# Patient Record
Sex: Female | Born: 1965 | Race: Black or African American | Hispanic: No | Marital: Single | Smoking: Former smoker
Health system: Southern US, Community
[De-identification: ages and names within clinical notes are randomized; demographics above are authoritative.]

## PROBLEM LIST (undated history)

## (undated) DIAGNOSIS — I10 Essential (primary) hypertension: Secondary | ICD-10-CM

## (undated) DIAGNOSIS — E119 Type 2 diabetes mellitus without complications: Secondary | ICD-10-CM

## (undated) HISTORY — PX: ABDOMINAL HYSTERECTOMY: SHX81

---

## 2013-10-12 DIAGNOSIS — E1169 Type 2 diabetes mellitus with other specified complication: Secondary | ICD-10-CM | POA: Insufficient documentation

## 2013-10-12 DIAGNOSIS — I10 Essential (primary) hypertension: Secondary | ICD-10-CM | POA: Insufficient documentation

## 2013-10-12 DIAGNOSIS — E785 Hyperlipidemia, unspecified: Secondary | ICD-10-CM | POA: Insufficient documentation

## 2014-04-06 ENCOUNTER — Emergency Department: Payer: Self-pay | Admitting: Emergency Medicine

## 2014-04-06 LAB — COMPREHENSIVE METABOLIC PANEL
ALK PHOS: 94 U/L
ANION GAP: 6 — AB (ref 7–16)
AST: 12 U/L — AB (ref 15–37)
Albumin: 3.6 g/dL (ref 3.4–5.0)
BILIRUBIN TOTAL: 0.4 mg/dL (ref 0.2–1.0)
BUN: 15 mg/dL (ref 7–18)
CALCIUM: 10.2 mg/dL — AB (ref 8.5–10.1)
CHLORIDE: 98 mmol/L (ref 98–107)
Co2: 27 mmol/L (ref 21–32)
Creatinine: 0.62 mg/dL (ref 0.60–1.30)
EGFR (African American): >60
EGFR (Non-African Amer.): >60
Glucose: 347 mg/dL — ABNORMAL HIGH (ref 65–99)
OSMOLALITY: 277 (ref 275–301)
Potassium: 4 mmol/L (ref 3.5–5.1)
SGPT (ALT): 22 U/L (ref 12–78)
Sodium: 131 mmol/L — ABNORMAL LOW (ref 136–145)
TOTAL PROTEIN: 7.8 g/dL (ref 6.4–8.2)

## 2014-04-06 LAB — HEMOGLOBIN A1C: Hemoglobin A1C: 10.8 % — ABNORMAL HIGH (ref 4.2–6.3)

## 2014-04-06 LAB — URINALYSIS, COMPLETE
BILIRUBIN, UR: NEGATIVE
BLOOD: NEGATIVE
Bacteria: NONE SEEN
LEUKOCYTE ESTERASE: NEGATIVE
Nitrite: NEGATIVE
PROTEIN: NEGATIVE
Ph: 5 (ref 4.5–8.0)
Specific Gravity: 1.027 (ref 1.003–1.030)
WBC UR: 1 /HPF (ref 0–5)

## 2014-04-06 LAB — CBC
HCT: 44.4 % (ref 35.0–47.0)
HGB: 15 g/dL (ref 12.0–16.0)
MCH: 31.3 pg (ref 26.0–34.0)
MCHC: 33.8 g/dL (ref 32.0–36.0)
MCV: 93 fL (ref 80–100)
Platelet: 334 10*3/uL (ref 150–440)
RBC: 4.79 10*6/uL (ref 3.80–5.20)
RDW: 13.7 % (ref 11.5–14.5)
WBC: 9.1 10*3/uL (ref 3.6–11.0)

## 2014-04-06 LAB — TROPONIN I: Troponin-I: <0.02 ng/mL

## 2014-09-15 ENCOUNTER — Emergency Department: Payer: Self-pay | Admitting: Emergency Medicine

## 2015-12-22 ENCOUNTER — Emergency Department
Admission: EM | Admit: 2015-12-22 | Discharge: 2015-12-22 | Disposition: A | Discharge status: Home / Self Care | Payer: Commercial Managed Care - PPO | Attending: Emergency Medicine | Admitting: Emergency Medicine

## 2015-12-22 ENCOUNTER — Encounter: Payer: Self-pay | Admitting: Emergency Medicine

## 2015-12-22 DIAGNOSIS — E1165 Type 2 diabetes mellitus with hyperglycemia: Secondary | ICD-10-CM | POA: Insufficient documentation

## 2015-12-22 DIAGNOSIS — Z7984 Long term (current) use of oral hypoglycemic drugs: Secondary | ICD-10-CM | POA: Diagnosis not present

## 2015-12-22 DIAGNOSIS — R739 Hyperglycemia, unspecified: Secondary | ICD-10-CM

## 2015-12-22 DIAGNOSIS — I10 Essential (primary) hypertension: Secondary | ICD-10-CM | POA: Diagnosis not present

## 2015-12-22 DIAGNOSIS — Z79899 Other long term (current) drug therapy: Secondary | ICD-10-CM | POA: Diagnosis not present

## 2015-12-22 DIAGNOSIS — F17210 Nicotine dependence, cigarettes, uncomplicated: Secondary | ICD-10-CM | POA: Diagnosis not present

## 2015-12-22 DIAGNOSIS — K0889 Other specified disorders of teeth and supporting structures: Secondary | ICD-10-CM | POA: Diagnosis not present

## 2015-12-22 DIAGNOSIS — Z7982 Long term (current) use of aspirin: Secondary | ICD-10-CM | POA: Diagnosis not present

## 2015-12-22 HISTORY — DX: Essential (primary) hypertension: I10

## 2015-12-22 HISTORY — DX: Type 2 diabetes mellitus without complications: E11.9

## 2015-12-22 LAB — CBC
HCT: 42.9 % (ref 35.0–47.0)
Hemoglobin: 14.3 g/dL (ref 12.0–16.0)
MCH: 30.2 pg (ref 26.0–34.0)
MCHC: 33.3 g/dL (ref 32.0–36.0)
MCV: 90.6 fL (ref 80.0–100.0)
PLATELETS: 344 10*3/uL (ref 150–440)
RBC: 4.74 MIL/uL (ref 3.80–5.20)
RDW: 13.6 % (ref 11.5–14.5)
WBC: 9.3 10*3/uL (ref 3.6–11.0)

## 2015-12-22 LAB — URINALYSIS COMPLETE WITH MICROSCOPIC (ARMC ONLY)
Bacteria, UA: NONE SEEN
Bilirubin Urine: NEGATIVE
HGB URINE DIPSTICK: NEGATIVE
Leukocytes, UA: NEGATIVE
NITRITE: NEGATIVE
PROTEIN: NEGATIVE mg/dL
SPECIFIC GRAVITY, URINE: 1.026 (ref 1.005–1.030)
pH: 5 (ref 5.0–8.0)

## 2015-12-22 LAB — COMPREHENSIVE METABOLIC PANEL
ALT: 24 U/L (ref 14–54)
AST: 15 U/L (ref 15–41)
Albumin: 3.5 g/dL (ref 3.5–5.0)
Alkaline Phosphatase: 80 U/L (ref 38–126)
Anion gap: 5 (ref 5–15)
BILIRUBIN TOTAL: 0.3 mg/dL (ref 0.3–1.2)
BUN: 15 mg/dL (ref 6–20)
CHLORIDE: 101 mmol/L (ref 101–111)
CO2: 25 mmol/L (ref 22–32)
Calcium: 9.4 mg/dL (ref 8.9–10.3)
Creatinine, Ser: 0.7 mg/dL (ref 0.44–1.00)
Glucose, Bld: 301 mg/dL — ABNORMAL HIGH (ref 65–99)
POTASSIUM: 3.8 mmol/L (ref 3.5–5.1)
Sodium: 131 mmol/L — ABNORMAL LOW (ref 135–145)
TOTAL PROTEIN: 6.8 g/dL (ref 6.5–8.1)

## 2015-12-22 LAB — GLUCOSE, CAPILLARY
GLUCOSE-CAPILLARY: 250 mg/dL — AB (ref 65–99)
Glucose-Capillary: 295 mg/dL — ABNORMAL HIGH (ref 65–99)

## 2015-12-22 MED ORDER — CEPHALEXIN 500 MG PO CAPS: 500.0000 mg | ORAL_CAPSULE | Freq: Four times a day (QID) | ORAL | Status: AC

## 2015-12-22 MED ORDER — CEPHALEXIN 500 MG PO CAPS
500.0000 mg | ORAL_CAPSULE | Freq: Once | ORAL | Status: AC
  Administered 2015-12-22: 500 mg via ORAL
  Filled 2015-12-22: qty 1

## 2015-12-22 NOTE — ED Notes (Addendum)
Patient ambulatory to triage with steady gait, without difficulty or distress noted; pt reports BS elevated x week; seen PCP for dizziness and blurry vision and rx  glipizide ER and has not controlled it; BS 463 at home; pt denies any c/o at present but does st she has had recent left lower tooth sensitivity recently

## 2015-12-22 NOTE — ED Notes (Signed)
Pt presents to ED with c/o elevated blood sugar x 1 week. States was seen by PCP 3 days ago and placed on Glipizide ER 10 mg/day, along with her Metformin 1000g BID. Pt reports checked blood sugar at 4 am this morning, it registered 461. Pt states "I don't understand why it doubles after I eat, I can't get my sugar under 200...it's been running 550-570 for the past week." Pt is to follow up with PCP in 3 months. Pt also c/o left lower tooth pain, pt states "I wonder if that is what's making my sugar go up."  Pt denies chest pain, shortness of breath, dizziness or vision problems at this time. Pt alert and oriented x 4, no increased work in breathing noted. MD at bedside.

## 2015-12-22 NOTE — ED Provider Notes (Signed)
[facility] Emergency Department Provider Note  ____________________________________________  Time seen: Approximately 518 AM  I have reviewed the triage vital signs and the nursing notes.   HISTORY  Chief Complaint Hyperglycemia    HPI Ashley Ochoa is a 50 y.o. female who comes into the hospital with elevated blood sugar. The patient reports that she is unable to get her sugars under 200 and have been running 500-570. She reports that she checked it at work tonight at 4 AM and the blood sugar was 461. She reports her blood sugar was down to 250 the other day but as soon as she ate a shot back up. The patient reports that her tooth has been bothering her and she is unsure if that has anything to do with her blood sugars being elevated. The patient saw her primary care physician 3 days ago and was started on glipizide ER to take with her metformin in the morning. The patient reports that it seems as though her symptoms when going on for the past week although it's been longer than that. The patient reports that she hadn't been checking her sugars prior to week ago. The patient reports that she ate poorly over the holidays but has been trying to eat sandwiches as well as solids and green vegetables. She reports that she is supposed to follow back up with her primary care physician in 3 months. The patient came in discussion was unsure what to do with her blood sugar being so high.   Past Medical History  Diagnosis Date  . Diabetes mellitus without complication (HCC)   . Hypertension     There are no active problems to display for this patient.   Past Surgical History  Procedure Laterality Date  . Abdominal hysterectomy      partial     Current Outpatient Rx  Name  Route  Sig  Dispense  Refill  . aspirin EC 81 MG tablet   Oral   Take 81 mg by mouth daily.         Marland Kitchen glipiZIDE (GLUCOTROL XL) 10 MG 24 hr tablet   Oral   Take 1 tablet by mouth daily.        Marland Kitchen lisinopril (PRINIVIL,ZESTRIL) 5 MG tablet   Oral   Take 5 mg by mouth daily.         . metFORMIN (GLUCOPHAGE) 1000 MG tablet   Oral   Take 1,000 mg by mouth 2 (two) times daily with a meal.         . simvastatin (ZOCOR) 20 MG tablet   Oral   Take 1 tablet by mouth daily.           Allergies Review of patient's allergies indicates no known allergies.  No family history on file.  Social History Social History  Substance Use Topics  . Smoking status: Current Every Day Smoker -- 0.50 packs/day    Types: Cigarettes  . Smokeless tobacco: None  . Alcohol Use: No    Review of Systems Constitutional: No fever/chills Eyes: No visual changes. ENT: No sore throat. Cardiovascular: Denies chest pain. Respiratory: Denies shortness of breath. Gastrointestinal: No abdominal pain.  No nausea, no vomiting.  No diarrhea.  No constipation. Genitourinary: Negative for dysuria. Musculoskeletal: Negative for back pain. Skin: Negative for rash. Neurological: Negative for headaches, focal weakness or numbness.  10-point ROS otherwise negative.  ____________________________________________   PHYSICAL EXAM:  VITAL SIGNS: ED Triage Vitals  Enc Vitals Group  BP 12/22/15 0444 141/85 mmHg     Pulse Rate 12/22/15 0444 88     Resp 12/22/15 0444 18     Temp 12/22/15 0444 97.9 F (36.6 C)     Temp Source 12/22/15 0444 Oral     SpO2 12/22/15 0444 95 %     Weight 12/22/15 0444 198 lb 3.2 oz (89.903 kg)     Height 12/22/15 0444  (1.549 m)     Head Cir --      Peak Flow --      Pain Score --      Pain Loc --      Pain Edu? --      Excl. in GC? --     Constitutional: Alert and oriented. Well appearing and in no acute distress. Eyes: Conjunctivae are normal. PERRL. EOMI. Head: Atraumatic. Nose: No congestion/rhinnorhea. Mouth/Throat: Mucous membranes are moist.  Oropharynx non-erythematous. Cardiovascular: Normal rate, regular rhythm. Grossly normal heart sounds.   Good peripheral circulation. Respiratory: Normal respiratory effort.  No retractions. Lungs CTAB. Gastrointestinal: Soft and nontender. No distention. Positive bowel sounds Musculoskeletal: No lower extremity tenderness nor edema.   Neurologic:  Normal speech and language.  Skin:  Skin is warm, dry and intact.  Psychiatric: Mood and affect are normal.   ____________________________________________   LABS (all labs ordered are listed, but only abnormal results are displayed)  Labs Reviewed  GLUCOSE, CAPILLARY - Abnormal; Notable for the following:    Glucose-Capillary 295 (*)    All other components within normal limits  COMPREHENSIVE METABOLIC PANEL - Abnormal; Notable for the following:    Sodium 131 (*)    Glucose, Bld 301 (*)    All other components within normal limits  URINALYSIS COMPLETEWITH MICROSCOPIC ([facility] ONLY) - Abnormal; Notable for the following:    Color, Urine YELLOW (*)    APPearance CLEAR (*)    Glucose, UA >500 (*)    Ketones, ur TRACE (*)    Squamous Epithelial / LPF 0-5 (*)    All other components within normal limits  CBC  CBG MONITORING, ED  CBG MONITORING, ED   ____________________________________________  EKG  none ____________________________________________  RADIOLOGY  none ____________________________________________   PROCEDURES  Procedure(s) performed: None  Critical Care performed: No  ____________________________________________   INITIAL IMPRESSION / ASSESSMENT AND PLAN / ED COURSE  Pertinent labs & imaging results that were available during my care of the patient were reviewed by me and considered in my medical decision making (see chart for details).  This is a 50 year old female who comes into the hospital today with elevated blood sugar. She reports that every time she eats her blood sugars go up. We had a conversation about eating too much bread and starches which will elevate her blood sugar. The patient also is having  some tooth pain in her jaw which may be contributing to her symptoms. The patient's urinalysis is unremarkable and her CBC is unremarkable. The patient is able to orally hydrate and I did have a long conversation with the patient about the appropriate dietary restrictions for patient with diabetes. I informed her that she needs to be on her medication for a little bit longer than 3 days to ensure that is actually working. The patient will be discharged to home to follow-up with her primary care physician. Her repeat fingerstick blood sugar was 250. ____________________________________________   FINAL CLINICAL IMPRESSION(S) / ED DIAGNOSES  Final diagnoses:  Hyperglycemia  Pain, dental      Revonda Standard  Catalina Gravel, MD 12/22/15 [id number]

## 2015-12-22 NOTE — Discharge Instructions (Signed)
Hyperglycemia °Hyperglycemia occurs when the glucose (sugar) in your blood is too high. Hyperglycemia can happen for many reasons, but it most often happens to people who do not know they have diabetes or are not managing their diabetes properly.  °CAUSES  °Whether you have diabetes or not, there are other causes of hyperglycemia. Hyperglycemia can occur when you have diabetes, but it can also occur in other situations that you might not be as aware of, such as: °Diabetes °· If you have diabetes and are having problems controlling your blood glucose, hyperglycemia could occur because of some of the following reasons: °¨ Not following your meal plan. °¨ Not taking your diabetes medications or not taking it properly. °¨ Exercising less or doing less activity than you normally do. °¨ Being sick. °Pre-diabetes °· This cannot be ignored. Before people develop Type 2 diabetes, they almost always have "pre-diabetes." This is when your blood glucose levels are higher than normal, but not yet high enough to be diagnosed as diabetes. Research has shown that some long-term damage to the body, especially the heart and circulatory system, may already be occurring during pre-diabetes. If you take action to manage your blood glucose when you have pre-diabetes, you may delay or prevent Type 2 diabetes from developing. °Stress °· If you have diabetes, you may be "diet" controlled or on oral medications or insulin to control your diabetes. However, you may find that your blood glucose is higher than usual in the hospital whether you have diabetes or not. This is often referred to as "stress hyperglycemia." Stress can elevate your blood glucose. This happens because of hormones put out by the body during times of stress. If stress has been the cause of your high blood glucose, it can be followed regularly by your caregiver. That way he/she can make sure your hyperglycemia does not continue to get worse or progress to  diabetes. °Steroids °· Steroids are medications that act on the infection fighting system (immune system) to block inflammation or infection. One side effect can be a rise in blood glucose. Most people can produce enough extra insulin to allow for this rise, but for those who cannot, steroids make blood glucose levels go even higher. It is not unusual for steroid treatments to "uncover" diabetes that is developing. It is not always possible to determine if the hyperglycemia will go away after the steroids are stopped. A special blood test called an A1c is sometimes done to determine if your blood glucose was elevated before the steroids were started. °SYMPTOMS °· Thirsty. °· Frequent urination. °· Dry mouth. °· Blurred vision. °· Tired or fatigue. °· Weakness. °· Sleepy. °· Tingling in feet or leg. °DIAGNOSIS  °Diagnosis is made by monitoring blood glucose in one or all of the following ways: °· A1c test. This is a chemical found in your blood. °· Fingerstick blood glucose monitoring. °· Laboratory results. °TREATMENT  °First, knowing the cause of the hyperglycemia is important before the hyperglycemia can be treated. Treatment may include, but is not be limited to: °· Education. °· Change or adjustment in medications. °· Change or adjustment in meal plan. °· Treatment for an illness, infection, etc. °· More frequent blood glucose monitoring. °· Change in exercise plan. °· Decreasing or stopping steroids. °· Lifestyle changes. °HOME CARE INSTRUCTIONS  °· Test your blood glucose as directed. °· Exercise regularly. Your caregiver will give you instructions about exercise. Pre-diabetes or diabetes which comes on with stress is helped by exercising. °· Eat wholesome,   balanced meals. Eat often and at regular, fixed times. Your caregiver or nutritionist will give you a meal plan to guide your sugar intake. °· Being at an ideal weight is important. If needed, losing as little as 10 to 15 pounds may help improve blood  glucose levels. °SEEK MEDICAL CARE IF:  °· You have questions about medicine, activity, or diet. °· You continue to have symptoms (problems such as increased thirst, urination, or weight gain). °SEEK IMMEDIATE MEDICAL CARE IF:  °· You are vomiting or have diarrhea. °· Your breath smells fruity. °· You are breathing faster or slower. °· You are very sleepy or incoherent. °· You have numbness, tingling, or pain in your feet or hands. °· You have chest pain. °· Your symptoms get worse even though you have been following your caregiver's orders. °· If you have any other questions or concerns. °  °This information is not intended to replace advice given to you by your health care provider. Make sure you discuss any questions you have with your health care provider. °  °Document Released: 05/16/2001 Document Revised: 02/12/2012 Document Reviewed: 07/27/2015 °Elsevier Interactive Patient Education ©2016 [employer] ° °

## 2015-12-22 NOTE — ED Notes (Signed)

## 2016-10-29 ENCOUNTER — Encounter: Payer: Self-pay | Admitting: Emergency Medicine

## 2016-10-29 ENCOUNTER — Emergency Department
Admission: EM | Admit: 2016-10-29 | Discharge: 2016-10-29 | Disposition: A | Discharge status: Home / Self Care | Payer: Commercial Managed Care - PPO | Attending: Emergency Medicine | Admitting: Emergency Medicine

## 2016-10-29 ENCOUNTER — Emergency Department: Payer: Commercial Managed Care - PPO

## 2016-10-29 DIAGNOSIS — Z7982 Long term (current) use of aspirin: Secondary | ICD-10-CM | POA: Insufficient documentation

## 2016-10-29 DIAGNOSIS — F17210 Nicotine dependence, cigarettes, uncomplicated: Secondary | ICD-10-CM | POA: Insufficient documentation

## 2016-10-29 DIAGNOSIS — E119 Type 2 diabetes mellitus without complications: Secondary | ICD-10-CM | POA: Insufficient documentation

## 2016-10-29 DIAGNOSIS — I10 Essential (primary) hypertension: Secondary | ICD-10-CM | POA: Insufficient documentation

## 2016-10-29 DIAGNOSIS — Z7984 Long term (current) use of oral hypoglycemic drugs: Secondary | ICD-10-CM | POA: Insufficient documentation

## 2016-10-29 DIAGNOSIS — Z79899 Other long term (current) drug therapy: Secondary | ICD-10-CM | POA: Insufficient documentation

## 2016-10-29 DIAGNOSIS — M7552 Bursitis of left shoulder: Secondary | ICD-10-CM

## 2016-10-29 MED ORDER — NAPROXEN 500 MG PO TABS: 500.0000 mg | ORAL_TABLET | Freq: Two times a day (BID) | ORAL | Status: DC

## 2016-10-29 MED ORDER — ORPHENADRINE CITRATE 30 MG/ML IJ SOLN: 60.0000 mg | Freq: Two times a day (BID) | INTRAMUSCULAR | Status: DC

## 2016-10-29 MED ORDER — KETOROLAC TROMETHAMINE 60 MG/2ML IM SOLN: 30.0000 mg | Freq: Once | INTRAMUSCULAR | Status: DC

## 2016-10-29 NOTE — ED Provider Notes (Signed)
[facility] Emergency Department Provider Note   __[facility]__________________________________________   None    (approximate)  I have reviewed the triage vital signs and the nursing notes.   HISTORY  Chief Complaint Shoulder Pain    HPI Ashley Ochoa is a [age] y.o. female patient complaining of 3 weeks left shoulder pain. Patient states pain increase with abduction and opiate reaching. Patient denies any single provocative incident for pain. 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 No palliative measures for this complaint. Patient is right-hand dominant.   Past Medical History:  Diagnosis Date  . Diabetes mellitus without complication (HCC)   . Hypertension     There are no active problems to display for this patient.   Past Surgical History:  Procedure Laterality Date  . ABDOMINAL HYSTERECTOMY     partial     Prior to Admission medications   Medication Sig Start Date End Date Taking? Authorizing Provider  aspirin EC 81 MG tablet Take 81 mg by mouth daily.    Historical Provider, MD  glipiZIDE (GLUCOTROL XL) 10 MG 24 hr tablet Take 1 tablet by mouth daily. 12/15/15   Historical Provider, MD  lisinopril (PRINIVIL,ZESTRIL) 5 MG tablet Take 5 mg by mouth daily.    Historical Provider, MD  metFORMIN (GLUCOPHAGE) 1000 MG tablet Take 1,000 mg by mouth 2 (two) times daily with a meal.    Historical Provider, MD  naproxen (NAPROSYN) 500 MG tablet Take 1 tablet (500 mg total) by mouth 2 (two) times daily with a meal. 10/29/16   Joni Reiningonald K Venus Ruhe, PA-C  simvastatin (ZOCOR) 20 MG tablet Take 1 tablet by mouth daily. 05/10/15 05/09/16  Historical Provider, MD    Allergies Patient has no known allergies.  No family history on file.  Social History Social History  Substance Use Topics  . Smoking status: Current Every Day Smoker    Packs/day: 0.50    Types: Cigarettes  . Smokeless tobacco: Never Used  .  Alcohol use No    Review of Systems Constitutional: No fever/chills Eyes: No visual changes. ENT: No sore throat. Cardiovascular: Denies chest pain. Respiratory: Denies shortness of breath. Gastrointestinal: No abdominal pain.  No nausea, no vomiting.  No diarrhea.  No constipation. Genitourinary: Negative for dysuria. Musculoskeletal: Negative for back pain. Skin: Negative for rash. Neurological: Negative for headaches, focal weakness or numbness. Endocrine:Diabetes and hypertension   ____________________________________________   PHYSICAL EXAM:  VITAL SIGNS: ED Triage Vitals  Enc Vitals Group     BP 10/29/16 1627 128/89     Pulse Rate 10/29/16 1627 71     Resp 10/29/16 1627 16     Temp 10/29/16 1627 98.1 F (36.7 C)     Temp Source 10/29/16 1627 Oral     SpO2 10/29/16 1627 97 %     Weight 10/29/16 1630 201 lb (91.2 kg)     Height 10/29/16 1630 5\' 1"  (1.549 m)     Head Circumference --      Peak Flow --      Pain Score 10/29/16 1630 6     Pain Loc --      Pain Edu? --      Excl. in GC? --     Constitutional: Alert and oriented. Well appearing and in no acute distress. Eyes: Conjunctivae are normal. PERRL. EOMI. Head: Atraumatic. Nose: No congestion/rhinnorhea. Mouth/Throat: Mucous membranes are moist.  Oropharynx non-erythematous. Neck: No stridor.  No cervical spine tenderness to palpation. Hematological/Lymphatic/Immunilogical: No cervical lymphadenopathy. Cardiovascular: Normal rate, regular rhythm. Grossly normal heart sounds.  Good peripheral circulation. Respiratory: Normal respiratory effort.  No retractions. Lungs CTAB. Gastrointestinal: Soft and nontender. No distention. No abdominal bruits. No CVA tenderness. Musculoskeletal: Arms deformities the left upper shoulder. Patient decreased range of motion with abduction overhead reaching. Strength iresistance 3/5. Neurologic:  Normal speech and language. No gross focal neurologic deficits are appreciated.  No gait instability. Skin:  Skin is warm, dry and intact. No rash noted. Psychiatric: Mood and affect are normal. Speech and behavior are normal.  ____________________________________________   LABS (all labs ordered are listed, but only abnormal results are displayed)  Labs Reviewed - No data to display ____________________________________________  EKG   ____________________________________________  RADIOLOGY   __No acute findings x-ray of the left shoulder. __________________________________________   PROCEDURES  Procedure(s) performed: None  Procedures  Critical Care performed: No  ____________________________________________   INITIAL IMPRESSION / ASSESSMENT AND PLAN / ED COURSE  Pertinent labs & imaging results that were available during my care of the patient were reviewed by me and considered in my medical decision making (see chart for details).  Bursitis of the left shoulder. Patient given discharge care instructions. Patient given a prescription for naproxen and advised follow-up family doctor orthopedic clinic if condition persists.  Clinical Course      ____________________________________________   FINAL CLINICAL IMPRESSION(S) / ED DIAGNOSES  Final diagnoses:  Bursitis of shoulder, left      NEW MEDICATIONS STARTED DURING THIS VISIT:  New Prescriptions   NAPROXEN (NAPROSYN) 500 MG TABLET    Take 1 tablet (500 mg total) by mouth 2 (two) times daily with a meal.     Note:  This document was prepared using Dragon voice recognition software and may include unintentional dictation errors.    Joni Reiningonald K Beckie Viscardi, PA-C 10/29/16 1735    Jene Everyobert Kinner, MD 10/29/16 [phone]

## 2016-10-29 NOTE — ED Triage Notes (Signed)
Left shoulder pain x 3 weeks. Has not spoken with her pcp about the pain. Pain is with raising arm.

## 2016-11-01 NOTE — ED Notes (Signed)
Patient returns to ED Waiting Room 11/01/16 at approx. 1115 requesting work note from visit on 10/29/16.  Requesting to return to work 11/06/16.  Discussed request with Charge Nurse - Ashley Ochoa.  Explained to Ashley Ochoa that we could not write the work note she was requesting.  Offered patient a note stating that she was seen in the ED on 10/29/16.  Patient refused and was displeased.

## 2017-09-06 ENCOUNTER — Ambulatory Visit: Payer: Self-pay | Admitting: Family Medicine

## 2017-09-06 DIAGNOSIS — E119 Type 2 diabetes mellitus without complications: Secondary | ICD-10-CM

## 2017-09-06 DIAGNOSIS — I10 Essential (primary) hypertension: Secondary | ICD-10-CM

## 2017-09-06 DIAGNOSIS — E785 Hyperlipidemia, unspecified: Secondary | ICD-10-CM

## 2017-09-06 DIAGNOSIS — E1169 Type 2 diabetes mellitus with other specified complication: Secondary | ICD-10-CM

## 2017-09-06 MED ORDER — SIMVASTATIN 20 MG PO TABS: 20.0000 mg | ORAL_TABLET | Freq: Every day | ORAL | 0 refills | Status: DC

## 2017-09-06 MED ORDER — GLIPIZIDE ER 10 MG PO TB24: 10.0000 mg | ORAL_TABLET | Freq: Every day | ORAL | 0 refills | Status: DC

## 2017-09-06 MED ORDER — NAPROXEN 500 MG PO TABS: 500.0000 mg | ORAL_TABLET | Freq: Two times a day (BID) | ORAL | Status: DC

## 2017-09-06 MED ORDER — METFORMIN HCL 1000 MG PO TABS: 1000.0000 mg | ORAL_TABLET | Freq: Two times a day (BID) | ORAL | 0 refills | Status: DC

## 2017-09-06 MED ORDER — LISINOPRIL 5 MG PO TABS: 5.0000 mg | ORAL_TABLET | Freq: Every day | ORAL | 0 refills | Status: DC

## 2017-09-06 NOTE — Patient Instructions (Signed)
Take naproxen twice a day for up to 10 days.  Avoid strenous activities as much as possible.

## 2017-09-06 NOTE — Addendum Note (Signed)
Addended by: Hetty Ely on: 09/06/2017 07:56 PM   Modules accepted: Orders

## 2017-09-06 NOTE — Progress Notes (Signed)
Subjective:     Patient ID: Ashley Ochoa, female   DOB: [birth year], 51 y.o.   MRN: [id number]  HPI   Pt has type 2 DM.  Is on Glipidie and Metformin .  BS's usually are controlled, around, 130 in the AM and around 160's  later times of the day.  No increased thirst or urination.  No hypoglycemic Sx's.   She ran out of Lisinopril for HTN.  Takes Simvastatin daily.  Has not had labs checked in a year or more.   Has had soreness in her central chest for 3 weeks.  Hurts with movements or to the touch.  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   Review of Systems     Objective:   Physical Exam  A+O, no distress OP and Con clear Lungs CTA RRR, no murmur Abdm: obese, nontender +2 [pulses, no edema. Focal tenderness over the sternum, no nodule, erythema or warmth    Assessment:     and    Plan:  1.  DM. Stay on Glipizide once a day and Metformin twice a day. Check A1c, Met B and MA. 2.  HTN. Stay on Lisnopril daily.  Check CBC and Met B next week. 3.  Hyperlipidemia.  Stay on Simvastatin 20 mg daily.  Check lipids and liver. 4.  Sternal pain, possible costochondritis.  Start Naproxen 500 mg BID.  Avoid exacerbating activities. 5.  RTC in 3 months, sooner if needed for acute concerns.

## 2017-09-12 ENCOUNTER — Ambulatory Visit: Payer: Self-pay | Admitting: Internal Medicine

## 2017-12-05 ENCOUNTER — Other Ambulatory Visit: Payer: Self-pay

## 2017-12-11 ENCOUNTER — Ambulatory Visit: Payer: Self-pay

## 2017-12-12 ENCOUNTER — Other Ambulatory Visit: Payer: Self-pay

## 2017-12-12 DIAGNOSIS — I10 Essential (primary) hypertension: Secondary | ICD-10-CM

## 2017-12-12 DIAGNOSIS — E119 Type 2 diabetes mellitus without complications: Secondary | ICD-10-CM

## 2017-12-20 LAB — LIPID PANEL
CHOL/HDL RATIO: 2.8 ratio (ref 0.0–4.4)
CHOLESTEROL TOTAL: 154 mg/dL (ref 100–199)
HDL: 55 mg/dL (ref >39)
LDL Calculated: 76 mg/dL (ref 0–99)
TRIGLYCERIDES: 117 mg/dL (ref 0–149)
VLDL Cholesterol Cal: 23 mg/dL (ref 5–40)

## 2017-12-20 LAB — COMPREHENSIVE METABOLIC PANEL
A/G RATIO: 1.6 (ref 1.2–2.2)
ALK PHOS: 90 IU/L (ref 39–117)
ALT: 14 IU/L (ref 0–32)
AST: 12 IU/L (ref 0–40)
Albumin: 4.5 g/dL (ref 3.5–5.5)
BILIRUBIN TOTAL: 0.2 mg/dL (ref 0.0–1.2)
BUN/Creatinine Ratio: 21 (ref 9–23)
BUN: 13 mg/dL (ref 6–24)
CHLORIDE: 102 mmol/L (ref 96–106)
CO2: 26 mmol/L (ref 20–29)
Calcium: 10.4 mg/dL — ABNORMAL HIGH (ref 8.7–10.2)
Creatinine, Ser: 0.62 mg/dL (ref 0.57–1.00)
GFR calc non Af Amer: 104 mL/min/{1.73_m2} (ref >59)
GFR, EST AFRICAN AMERICAN: 120 mL/min/{1.73_m2} (ref >59)
GLUCOSE: 127 mg/dL — AB (ref 65–99)
Globulin, Total: 2.8 g/dL (ref 1.5–4.5)
POTASSIUM: 5.1 mmol/L (ref 3.5–5.2)
Sodium: 142 mmol/L (ref 134–144)
TOTAL PROTEIN: 7.3 g/dL (ref 6.0–8.5)

## 2017-12-20 LAB — CBC WITH DIFFERENTIAL/PLATELET

## 2017-12-20 LAB — HEMOGLOBIN A1C
Est. average glucose Bld gHb Est-mCnc: 154 mg/dL
Hgb A1c MFr Bld: 7 % — ABNORMAL HIGH (ref 4.8–5.6)

## 2017-12-25 ENCOUNTER — Ambulatory Visit: Payer: Self-pay | Admitting: Adult Health Nurse Practitioner

## 2017-12-25 VITALS — BP 158/94 | HR 78 | Temp 97.6°F | Wt 214.1 lb

## 2017-12-25 DIAGNOSIS — E119 Type 2 diabetes mellitus without complications: Secondary | ICD-10-CM

## 2017-12-25 DIAGNOSIS — I10 Essential (primary) hypertension: Secondary | ICD-10-CM

## 2017-12-25 MED ORDER — METFORMIN HCL 1000 MG PO TABS: 1000.0000 mg | ORAL_TABLET | Freq: Two times a day (BID) | ORAL | 4 refills | Status: DC

## 2017-12-25 MED ORDER — GLIPIZIDE ER 10 MG PO TB24: 10.0000 mg | ORAL_TABLET | Freq: Every day | ORAL | 1 refills | Status: DC

## 2017-12-25 MED ORDER — NAPROXEN 500 MG PO TABS: 500.0000 mg | ORAL_TABLET | Freq: Two times a day (BID) | ORAL | Status: DC | PRN

## 2017-12-25 MED ORDER — LISINOPRIL 5 MG PO TABS: 5.0000 mg | ORAL_TABLET | Freq: Every day | ORAL | 1 refills | Status: DC

## 2017-12-25 MED ORDER — SIMVASTATIN 20 MG PO TABS: 20.0000 mg | ORAL_TABLET | Freq: Every day | ORAL | 1 refills | Status: DC

## 2017-12-25 NOTE — Progress Notes (Signed)
  Patient: Ashley Ochoa Female    DOB: 01/25/1966   52 y.o.   MRN: [id number] Visit Date: 12/25/2017  Today's Provider: Jacelyn Pieah Doles-Johnson, NP   Chief Complaint  Patient presents with  . Follow-up    review previous labs   . Foot Injury    pain in heels when she wakes up and while working   Subjective:    HPI   Here for lab review and routine visit.   A1c was 7.  LDL-76  Pt states that she out of lisinopril x 1 week.  Pt states that she is cutting back on bread intake.  Pt states that she is getting ready to start exercising.   No Known Allergies Previous Medications   ASPIRIN EC 81 MG TABLET    Take 81 mg by mouth daily.   GLIPIZIDE (GLUCOTROL XL) 10 MG 24 HR TABLET    Take 1 tablet (10 mg total) by mouth daily.   LISINOPRIL (PRINIVIL,ZESTRIL) 5 MG TABLET    Take 1 tablet (5 mg total) by mouth daily.   METFORMIN (GLUCOPHAGE) 1000 MG TABLET    Take 1 tablet (1,000 mg total) by mouth 2 (two) times daily with a meal.   NAPROXEN (NAPROSYN) 500 MG TABLET    Take 1 tablet (500 mg total) by mouth 2 (two) times daily with a meal.   SIMVASTATIN (ZOCOR) 20 MG TABLET    Take 1 tablet (20 mg total) by mouth daily.    Review of Systems  All other systems reviewed and are negative.   Social History   Tobacco Use  . Smoking status: Former Smoker    Packs/day: 0.50    Types: Cigarettes    Last attempt to quit: 11/24/2017    Years since quitting: 0.0  . Smokeless tobacco: Never Used  Substance Use Topics  . Alcohol use: No   Objective:   BP (!) 158/94 (BP Location: Left Arm, Patient Position: Sitting, Cuff Size: Normal)   Pulse 78   Temp 97.6 F (36.4 C) (Oral)   Wt 214 lb 1.6 oz (97.1 kg)   BMI 40.45 kg/m   Physical Exam  Constitutional: She appears well-developed and well-nourished.  HENT:  Head: Normocephalic and atraumatic.  Cardiovascular: Normal rate, regular rhythm and normal heart sounds.  Pulmonary/Chest: Effort normal and breath sounds normal.  Abdominal: Soft.  Bowel sounds are normal.  Neurological: She is alert.  Skin: Skin is warm and dry.  Vitals reviewed.       Assessment & Plan:         HTN:   Not controlled.  Goal BP <140/90.  Continue current medication regimen.  Encourage low salt diet and exercise.  FU in 4 weeks for BP check only.   DM:  Controlled.  Not controlled.  Encourage diabetic diet and exercise.  Continue current medication regimen.   Naproxen PRN along with rolling both feet on frozen water bottles to help with foot pain. Obtain insoles or supportive shoes.      Jacelyn Pieah Doles-Johnson, NP   [facility] [place] [facility]

## 2018-01-23 ENCOUNTER — Ambulatory Visit: Payer: Self-pay | Admitting: Internal Medicine

## 2018-01-23 ENCOUNTER — Other Ambulatory Visit: Payer: Self-pay

## 2018-01-23 DIAGNOSIS — Z0130 Encounter for examination of blood pressure without abnormal findings: Secondary | ICD-10-CM

## 2018-01-23 NOTE — Progress Notes (Signed)
Pt came in for a BP check. BP was 128/86.

## 2018-01-24 MED ORDER — LISINOPRIL 5 MG PO TABS: 5.0000 mg | ORAL_TABLET | Freq: Every day | ORAL | 1 refills | Status: DC

## 2018-01-24 MED ORDER — METFORMIN HCL 1000 MG PO TABS: 1000.0000 mg | ORAL_TABLET | Freq: Two times a day (BID) | ORAL | 4 refills | Status: DC

## 2018-01-24 MED ORDER — SIMVASTATIN 20 MG PO TABS: 20.0000 mg | ORAL_TABLET | Freq: Every day | ORAL | 1 refills | Status: DC

## 2018-01-24 MED ORDER — NAPROXEN 500 MG PO TABS: 500.0000 mg | ORAL_TABLET | Freq: Two times a day (BID) | ORAL | Status: DC | PRN

## 2018-02-01 ENCOUNTER — Telehealth: Payer: Self-pay

## 2018-02-01 NOTE — Telephone Encounter (Signed)
-----   Message from Jacelyn Pieah Doles-Johnson, NP sent at 01/31/2018  5:47 PM EST ----- A1c much improved, down to 7 from 10.8. Keep up the good work! Other labs stable.

## 2018-02-01 NOTE — Telephone Encounter (Signed)
Called to give improved A1C and stable lab results. Ph number is no longer in service.

## 2018-04-23 ENCOUNTER — Other Ambulatory Visit: Payer: Self-pay

## 2018-04-30 ENCOUNTER — Ambulatory Visit: Payer: Self-pay | Admitting: Family Medicine

## 2018-04-30 VITALS — BP 104/72 | HR 86 | Temp 97.8°F | Wt 213.9 lb

## 2018-04-30 DIAGNOSIS — I10 Essential (primary) hypertension: Secondary | ICD-10-CM

## 2018-04-30 DIAGNOSIS — G629 Polyneuropathy, unspecified: Secondary | ICD-10-CM

## 2018-04-30 DIAGNOSIS — Z09 Encounter for follow-up examination after completed treatment for conditions other than malignant neoplasm: Secondary | ICD-10-CM

## 2018-04-30 DIAGNOSIS — E1169 Type 2 diabetes mellitus with other specified complication: Secondary | ICD-10-CM

## 2018-04-30 DIAGNOSIS — E119 Type 2 diabetes mellitus without complications: Secondary | ICD-10-CM

## 2018-04-30 DIAGNOSIS — E785 Hyperlipidemia, unspecified: Secondary | ICD-10-CM

## 2018-04-30 DIAGNOSIS — R0782 Intercostal pain: Secondary | ICD-10-CM

## 2018-04-30 DIAGNOSIS — H539 Unspecified visual disturbance: Secondary | ICD-10-CM

## 2018-04-30 MED ORDER — GABAPENTIN 100 MG PO CAPS: 100.0000 mg | ORAL_CAPSULE | Freq: Three times a day (TID) | ORAL | 1 refills | Status: DC

## 2018-04-30 MED ORDER — SIMVASTATIN 20 MG PO TABS: 20.0000 mg | ORAL_TABLET | Freq: Every day | ORAL | 1 refills | Status: DC

## 2018-04-30 MED ORDER — GLIPIZIDE ER 10 MG PO TB24: 10.0000 mg | ORAL_TABLET | Freq: Every day | ORAL | 1 refills | Status: DC

## 2018-04-30 MED ORDER — NAPROXEN 500 MG PO TABS: 500.0000 mg | ORAL_TABLET | Freq: Two times a day (BID) | ORAL | Status: DC | PRN

## 2018-04-30 MED ORDER — METFORMIN HCL 1000 MG PO TABS: 1000.0000 mg | ORAL_TABLET | Freq: Two times a day (BID) | ORAL | 4 refills | Status: DC

## 2018-04-30 MED ORDER — LISINOPRIL 5 MG PO TABS: 5.0000 mg | ORAL_TABLET | Freq: Every day | ORAL | 1 refills | Status: DC

## 2018-04-30 NOTE — Progress Notes (Signed)
Subjective:     Patient ID: Ashley Ochoa, female   DOB: Aug 04, 1966, 52 y.o.   MRN: [id number]   PCP: Raliegh Ip, NP  Chief Complaint  Patient presents with  . Follow-up     HPI   Ashley Ochoa has history of Hypertension and Diabetes, and Neuropathy.  She is here today for follow up.   Current Status: Ashley Ochoa is a 52 yo F here for f/u for HTN and DM. She presents w/ c/o of pain in L chest above breast w/ certain movements. She endorses SOB w/ pain. Pt was Rx Naproxen at last visit but never received it. She presents w/ c/o of neuropathy in feet w/ pain and hands w/ numbness and tingling. She endorses constipation on occasion. Denies cough, chest pain, and heart palpitations.   DM: c/o of neuropathy in her hands, feet and legs. She is taking Metformin . She has not been taking Glipizide 10 mg. Pt reports she is checking her CBG. Denies abdominal pain, nausea, vomiting, and diarrhea. She denies any signs of bleeding.   Past Medical History:  Diagnosis Date  . Diabetes mellitus without complication (HCC)   . Hypertension    History reviewed. No pertinent family history.   Past Surgical History:  Procedure Laterality Date  . ABDOMINAL HYSTERECTOMY     partial    Social History   Socioeconomic History  . Marital status: Single    Spouse name: Not on file  . Number of children: Not on file  . Years of education: Not on file  . Highest education level: Not on file  Occupational History  . Occupation: CNA  Social Needs  . Financial resource strain: Patient refused  . Food insecurity:    Worry: Never true    Inability: Never true  . Transportation needs:    Medical: No    Non-medical: No  Tobacco Use  . Smoking status: Former Smoker    Packs/day: 0.50    Types: Cigarettes    Last attempt to quit: 11/24/2017    Years since quitting: 0.4  . Smokeless tobacco: Never Used  Substance and Sexual Activity  . Alcohol use: No  . Drug use: Not on file  . Sexual  activity: Not on file  Lifestyle  . Physical activity:    Days per week: Patient refused    Minutes per session: Patient refused  . Stress: Not on file  Relationships  . Social connections:    Talks on phone: Patient refused    Gets together: Patient refused    Attends religious service: Patient refused    Active member of club or organization: Patient refused    Attends meetings of clubs or organizations: Patient refused    Relationship status: Patient refused  Other Topics Concern  . Not on file  Social History Narrative   Patient refused questioning.    There is no immunization history on file for this patient.    Current Meds  Medication Sig  . aspirin EC 81 MG tablet Take 81 mg by mouth daily.  Marland Kitchen lisinopril (PRINIVIL,ZESTRIL) 5 MG tablet Take 1 tablet (5 mg total) by mouth daily.  . metFORMIN (GLUCOPHAGE) 1000 MG tablet Take 1 tablet (1,000 mg total) by mouth 2 (two) times daily with a meal.  . naproxen (NAPROSYN) 500 MG tablet Take 1 tablet (500 mg total) by mouth 2 (two) times daily as needed.  . simvastatin (ZOCOR) 20 MG tablet Take 1 tablet (20 mg total) by mouth daily.  . [  DISCONTINUED] lisinopril (PRINIVIL,ZESTRIL) 5 MG tablet Take 1 tablet (5 mg total) by mouth daily.  . [DISCONTINUED] metFORMIN (GLUCOPHAGE) 1000 MG tablet Take 1 tablet (1,000 mg total) by mouth 2 (two) times daily with a meal.  . [DISCONTINUED] naproxen (NAPROSYN) 500 MG tablet Take 1 tablet (500 mg total) by mouth 2 (two) times daily as needed.  . [DISCONTINUED] simvastatin (ZOCOR) 20 MG tablet Take 1 tablet (20 mg total) by mouth daily.   No Known Allergies   BP 104/72 (BP Location: Left Arm)   Pulse 86   Temp 97.8 F (36.6 C)   Wt 213 lb 14.4 oz (97 kg)   BMI 40.42 kg/m    Review of Systems  Constitutional: Negative.   Gastrointestinal: Positive for constipation.  Musculoskeletal: Positive for back pain.  All other systems reviewed and are negative.  Last A1C was 7.0 65mo ago.    Objective:    Physical Exam  Constitutional: She is oriented to person, place, and time. She appears well-developed and well-nourished.  Cardiovascular: Normal rate, regular rhythm and normal heart sounds.  Pulmonary/Chest: Effort normal and breath sounds normal.  Abdominal: Soft. Bowel sounds are normal.  Feet:  Right Foot:  Protective Sensation: 10 sites tested. 10 sites sensed.  Left Foot:  Protective Sensation: 10 sites tested. 10 sites sensed.  Neurological: She is alert and oriented to person, place, and time.  Vitals reviewed.  Assessment:   1. Diabetes mellitus without complication (HCC) 2. Essential hypertension 3. Hyperlipidemia associated with type 2 diabetes mellitus (HCC) 4. Intercostal pain 5. Neuropathy 6. Visual changes 7. Encounter for diabetic foot exam (HCC) 8. Follow up  Plan:   1. Diabetes mellitus without complication (HCC) Hgb A1c on 12/12/2017 was decreased at 7.0 from 10.8 previously. She will continue to take Metformin and Glucotrol as directed.  - HgB A1c  2. Essential hypertension Blood pressure is 104/72. Continue Lisinopril as directed.  - CBC w/Diff - Comprehensive metabolic panel; Future  3. Hyperlipidemia associated with type 2 diabetes mellitus (HCC) Continue Simvastatin as directed.  - Lipid Profile  4. Intercostal pain Stable. She is occasionally experiencing reproducible pain. She will continue Naproxen as directed.   5. Neuropathy We will order Gabapentin 100 mg TID for neuropathy.   6. Visual changes We will refer to Optometry today.   7. Diabetic foot exam Performed today. Results were negative.   8. Follow up Follow up in 1 month.   Meds ordered this encounter  Medications  . gabapentin (NEURONTIN) 100 MG capsule    Sig: Take 1 capsule (100 mg total) by mouth 3 (three) times daily.    Dispense:  90 capsule    Refill:  1  . glipiZIDE (GLUCOTROL XL) 10 MG 24 hr tablet    Sig: Take 1 tablet (10 mg total) by mouth  daily.    Dispense:  90 tablet    Refill:  1  . lisinopril (PRINIVIL,ZESTRIL) 5 MG tablet    Sig: Take 1 tablet (5 mg total) by mouth daily.    Dispense:  90 tablet    Refill:  1  . naproxen (NAPROSYN) 500 MG tablet    Sig: Take 1 tablet (500 mg total) by mouth 2 (two) times daily as needed.    Dispense:  30 tablet    Refill:  00  . metFORMIN (GLUCOPHAGE) 1000 MG tablet    Sig: Take 1 tablet (1,000 mg total) by mouth 2 (two) times daily with a meal.    Dispense:  60 tablet    Refill:  4  . simvastatin (ZOCOR) 20 MG tablet    Sig: Take 1 tablet (20 mg total) by mouth daily.    Dispense:  90 tablet    Refill:  1   Raliegh Ip,  MSN, FNP-BC Patient [facility] [address] [place], [place] [address] [phone]

## 2018-05-01 LAB — CBC WITH DIFFERENTIAL/PLATELET
Basophils Absolute: 0.1 10*3/uL (ref 0.0–0.2)
Basos: 1 %
EOS (ABSOLUTE): 0.1 10*3/uL (ref 0.0–0.4)
Eos: 1 %
Hematocrit: 43.5 % (ref 34.0–46.6)
Hemoglobin: 14.4 g/dL (ref 11.1–15.9)
Immature Grans (Abs): 0 10*3/uL (ref 0.0–0.1)
Immature Granulocytes: 0 %
Lymphocytes Absolute: 4 10*3/uL — ABNORMAL HIGH (ref 0.7–3.1)
Lymphs: 42 %
MCH: 30 pg (ref 26.6–33.0)
MCHC: 33.1 g/dL (ref 31.5–35.7)
MCV: 91 fL (ref 79–97)
Monocytes Absolute: 0.6 10*3/uL (ref 0.1–0.9)
Monocytes: 7 %
Neutrophils Absolute: 4.7 10*3/uL (ref 1.4–7.0)
Neutrophils: 49 %
Platelets: 429 10*3/uL (ref 150–450)
RBC: 4.8 x10E6/uL (ref 3.77–5.28)
RDW: 14.6 % (ref 12.3–15.4)
WBC: 9.5 10*3/uL (ref 3.4–10.8)

## 2018-05-01 LAB — COMPREHENSIVE METABOLIC PANEL
ALT: 13 IU/L (ref 0–32)
AST: 12 IU/L (ref 0–40)
Albumin/Globulin Ratio: 1.6 (ref 1.2–2.2)
Albumin: 4.1 g/dL (ref 3.5–5.5)
Alkaline Phosphatase: 93 IU/L (ref 39–117)
BUN/Creatinine Ratio: 18 (ref 9–23)
BUN: 11 mg/dL (ref 6–24)
Bilirubin Total: 0.3 mg/dL (ref 0.0–1.2)
CO2: 23 mmol/L (ref 20–29)
Calcium: 9.6 mg/dL (ref 8.7–10.2)
Chloride: 104 mmol/L (ref 96–106)
Creatinine, Ser: 0.61 mg/dL (ref 0.57–1.00)
GFR calc Af Amer: 120 mL/min/{1.73_m2} (ref >59)
GFR calc non Af Amer: 104 mL/min/{1.73_m2} (ref >59)
Globulin, Total: 2.6 g/dL (ref 1.5–4.5)
Glucose: 109 mg/dL — ABNORMAL HIGH (ref 65–99)
Potassium: 4.4 mmol/L (ref 3.5–5.2)
Sodium: 142 mmol/L (ref 134–144)
Total Protein: 6.7 g/dL (ref 6.0–8.5)

## 2018-05-01 LAB — HEMOGLOBIN A1C
Est. average glucose Bld gHb Est-mCnc: 174 mg/dL
Hgb A1c MFr Bld: 7.7 % — ABNORMAL HIGH (ref 4.8–5.6)

## 2018-05-01 LAB — LIPID PANEL
Chol/HDL Ratio: 2.3 ratio (ref 0.0–4.4)
Cholesterol, Total: 153 mg/dL (ref 100–199)
HDL: 67 mg/dL (ref >39)
LDL Calculated: 66 mg/dL (ref 0–99)
Triglycerides: 101 mg/dL (ref 0–149)
VLDL Cholesterol Cal: 20 mg/dL (ref 5–40)

## 2018-05-15 ENCOUNTER — Ambulatory Visit: Payer: Self-pay | Admitting: Ophthalmology

## 2018-05-21 ENCOUNTER — Telehealth: Payer: Self-pay

## 2018-05-21 NOTE — Telephone Encounter (Signed)
-----   Message from Kallie LocksNatalie M Stroud, FNP sent at 05/20/2018  4:28 PM EDT ----- Regarding: "Lab Results" Please inform her the all labs are stable, however Hgb A1c has increased to 7.7 from 7.0.   She should continue to eat a DASH diet, decrease high-fats and high sugars in diet, increase water intake, increase vegetables, and increase activity with at least 30 minutes of cardio daily.

## 2018-05-21 NOTE — Telephone Encounter (Signed)
Left a vm for patient to callback 

## 2018-05-23 NOTE — Telephone Encounter (Signed)
Patient notified

## 2018-05-23 NOTE — Telephone Encounter (Signed)
-----   Message from Kallie LocksNatalie M Stroud, FNP sent at 05/20/2018  4:28 PM EDT ----- Regarding: "Lab Results" Please inform her the all labs are stable, however Hgb A1c has increased to 7.7 from 7.0.   She should continue to eat a DASH diet, decrease high-fats and high sugars in diet, increase water intake, increase vegetables, and increase activity with at least 30 minutes of cardio daily.

## 2018-05-24 ENCOUNTER — Telehealth: Payer: Self-pay

## 2018-05-24 NOTE — Telephone Encounter (Signed)
-----   Message from Kallie LocksNatalie M Stroud, FNP sent at 05/23/2018 10:49 PM EDT ----- Regarding: "Lab Results" Lyla SonCarrie,  Let patient know that previous labs were normal, except Hgb A1c has increased to 7.7 from 7.0. She needs to continue Metformin and Glucotrol as prescribed and remember to:  "Decrease high sodium intake, excessive alcohol intake, increase potassium intake, smoking cessation (if applicable), and increase physical activity to at least 30 minutes of Cardio exercise daily. Follow DASH diet."   Thank you!

## 2018-05-24 NOTE — Telephone Encounter (Signed)
Patient notified

## 2018-06-04 ENCOUNTER — Ambulatory Visit: Payer: Self-pay | Admitting: Family Medicine

## 2018-06-04 VITALS — BP 101/72 | HR 89 | Temp 97.7°F | Wt 210.1 lb

## 2018-06-04 DIAGNOSIS — R002 Palpitations: Secondary | ICD-10-CM

## 2018-06-04 DIAGNOSIS — E1169 Type 2 diabetes mellitus with other specified complication: Secondary | ICD-10-CM

## 2018-06-04 DIAGNOSIS — Z09 Encounter for follow-up examination after completed treatment for conditions other than malignant neoplasm: Secondary | ICD-10-CM

## 2018-06-04 DIAGNOSIS — E785 Hyperlipidemia, unspecified: Secondary | ICD-10-CM

## 2018-06-04 DIAGNOSIS — R0782 Intercostal pain: Secondary | ICD-10-CM

## 2018-06-04 DIAGNOSIS — I10 Essential (primary) hypertension: Secondary | ICD-10-CM

## 2018-06-04 DIAGNOSIS — E119 Type 2 diabetes mellitus without complications: Secondary | ICD-10-CM

## 2018-06-04 MED ORDER — NAPROXEN 500 MG PO TABS: 500.0000 mg | ORAL_TABLET | Freq: Two times a day (BID) | ORAL | 1 refills | Status: AC | PRN

## 2018-06-04 NOTE — Progress Notes (Signed)
Subjective:    Patient ID: Ashley Ochoa, female    DOB: 30-Dec-1965, 52 y.o.   MRN: [id number]   PCP: Raliegh Ip, NP  Chief Complaint  Patient presents with  . Follow-up  . Medication Management    she needs meds called in     HPI  [name] has a past medical history of Hypertension, and Diabetes. She is here today for follow up.   Current Status: Since her last office visit, she is doing well with no complaints.   She denies fevers, chills, recent infections, weight loss, and night sweats. Reports mild fatigue.   She has not had any headaches, visual changes, dizziness, and falls. No chest pain, cough and shortness of breath reported. Occasional heart palpitations.   No reports of GI problems such as nausea, vomiting, diarrhea, and constipation. She has no reports of blood in stools, dysuria and hematuria.   No depression or anxiety.   She denies pain today.    Past Medical History:  Diagnosis Date  . Diabetes mellitus without complication (HCC)   . Hypertension     History reviewed. No pertinent family history.  Social History   Socioeconomic History  . Marital status: Single    Spouse name: Not on file  . Number of children: Not on file  . Years of education: Not on file  . Highest education level: Not on file  Occupational History  . Occupation: CNA  Social Needs  . Financial resource strain: Patient refused  . Food insecurity:    Worry: Never true    Inability: Never true  . Transportation needs:    Medical: No    Non-medical: No  Tobacco Use  . Smoking status: Former Smoker    Packs/day: 0.50    Types: Cigarettes    Last attempt to quit: 11/24/2017    Years since quitting: 0.5  . Smokeless tobacco: Never Used  Substance and Sexual Activity  . Alcohol use: Yes    Alcohol/week: 1.2 oz    Types: 2 Standard drinks or equivalent per week    Comment: pt states she drinks on occasion roughly 2 times per week  . Drug use: Not on file  . Sexual  activity: Not on file  Lifestyle  . Physical activity:    Days per week: Patient refused    Minutes per session: Patient refused  . Stress: Not on file  Relationships  . Social connections:    Talks on phone: Patient refused    Gets together: Patient refused    Attends religious service: Patient refused    Active member of club or organization: Patient refused    Attends meetings of clubs or organizations: Patient refused    Relationship status: Patient refused  . Intimate partner violence:    Fear of current or ex partner: Patient refused    Emotionally abused: Patient refused    Physically abused: Patient refused    Forced sexual activity: Patient refused  Other Topics Concern  . Not on file  Social History Narrative   Patient refused questioning.    Past Surgical History:  Procedure Laterality Date  . ABDOMINAL HYSTERECTOMY     partial      There is no immunization history on file for this patient.  Current Meds  Medication Sig  . aspirin EC 81 MG tablet Take 81 mg by mouth daily.  Marland Kitchen gabapentin (NEURONTIN) 100 MG capsule Take 1 capsule (100 mg total) by mouth 3 (three) times daily.  Marland Kitchen  glipiZIDE (GLUCOTROL XL) 10 MG 24 hr tablet Take 1 tablet (10 mg total) by mouth daily.  Marland Kitchen. lisinopril (PRINIVIL,ZESTRIL) 5 MG tablet Take 1 tablet (5 mg total) by mouth daily.  . metFORMIN (GLUCOPHAGE) 1000 MG tablet Take 1 tablet (1,000 mg total) by mouth 2 (two) times daily with a meal.  . naproxen (NAPROSYN) 500 MG tablet Take 1 tablet (500 mg total) by mouth 2 (two) times daily as needed.  . simvastatin (ZOCOR) 20 MG tablet Take 1 tablet (20 mg total) by mouth daily.   No Known Allergies  BP 101/72   Pulse 89   Temp 97.7 F (36.5 C)   Wt 210 lb 1.6 oz (95.3 kg)   BMI 39.70 kg/m   Review of Systems  Constitutional: Negative.   HENT: Negative.   Eyes: Negative.   Respiratory: Negative.   Cardiovascular: Positive for palpitations (Occasional ).  Gastrointestinal: Negative.    Endocrine: Negative.   Genitourinary: Negative.   Musculoskeletal:       Generalized joint pain  Allergic/Immunologic: Negative.   Neurological: Positive for numbness.       Pain and numbness in extremities.   Hematological: Negative.   Psychiatric/Behavioral: Negative.    Objective:   Physical Exam  Constitutional: She is oriented to person, place, and time. She appears well-developed and well-nourished.  HENT:  Head: Normocephalic and atraumatic.  Right Ear: External ear normal.  Left Ear: External ear normal.  Nose: Nose normal.  Mouth/Throat: Oropharynx is clear and moist.  Eyes: Pupils are equal, round, and reactive to light. Conjunctivae and EOM are normal.  Neck: Normal range of motion. Neck supple.  Cardiovascular: Normal rate, regular rhythm, normal heart sounds and intact distal pulses.  Pulmonary/Chest: Effort normal and breath sounds normal.  Abdominal: Soft. Bowel sounds are normal.  Musculoskeletal: Normal range of motion.  Neurological: She is alert and oriented to person, place, and time.  Skin: Skin is warm and dry. Capillary refill takes less than 2 seconds.  Psychiatric: She has a normal mood and affect. Her behavior is normal. Judgment and thought content normal.  Nursing note and vitals reviewed.  Assessment & Plan:   1. Intercostal pain Pain and discomfort much improved. We will refill Naproxen today.   - naproxen (NAPROSYN) 500 MG tablet; Take 1 tablet (500 mg total) by mouth 2 (two) times daily as needed.  Dispense: 30 tablet; Refill: 1  2. Hyperlipidemia associated with type 2 diabetes mellitus (HCC) Cholesterol levels within normal range on 12/12/2017. Continue Simvastatin as prescribed.   3. Diabetes mellitus without complication (HCC) Hgb A1c is increased at 7.7, from 7.0 on 12/12/2017. Continue Glucotrol and Metformin as prescribed.    4. Essential hypertension Blood pressure is stable at 101/72 today. She will continue Lisinopril as prescribed.    5. 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 She will report to office of ED if signs and symptoms worsen.   6. Follow up She will follow up in 3 months.  Meds ordered this encounter  Medications  . naproxen (NAPROSYN) 500 MG tablet    Sig: Take 1 tablet (500 mg total) by mouth 2 (two) times daily as needed.    Dispense:  30 tablet    Refill:  1    Raliegh IpNatalie Floraine Buechler,  MSN, FNP-BC [facility] [place] [address] [place], [place] [id number] [phone]

## 2018-09-10 ENCOUNTER — Ambulatory Visit: Payer: Self-pay | Admitting: Family Medicine

## 2018-09-10 VITALS — BP 97/71 | HR 78 | Temp 97.8°F | Ht 60.5 in | Wt 214.1 lb

## 2018-09-10 DIAGNOSIS — E1169 Type 2 diabetes mellitus with other specified complication: Secondary | ICD-10-CM

## 2018-09-10 DIAGNOSIS — Z6841 Body Mass Index (BMI) 40.0 and over, adult: Secondary | ICD-10-CM

## 2018-09-10 DIAGNOSIS — I10 Essential (primary) hypertension: Secondary | ICD-10-CM

## 2018-09-10 DIAGNOSIS — Z09 Encounter for follow-up examination after completed treatment for conditions other than malignant neoplasm: Secondary | ICD-10-CM

## 2018-09-10 DIAGNOSIS — E119 Type 2 diabetes mellitus without complications: Secondary | ICD-10-CM

## 2018-09-10 DIAGNOSIS — G629 Polyneuropathy, unspecified: Secondary | ICD-10-CM

## 2018-09-10 DIAGNOSIS — E785 Hyperlipidemia, unspecified: Principal | ICD-10-CM

## 2018-09-10 MED ORDER — METFORMIN HCL 1000 MG PO TABS: 1000.0000 mg | ORAL_TABLET | Freq: Two times a day (BID) | ORAL | 6 refills | Status: DC

## 2018-09-10 MED ORDER — SIMVASTATIN 20 MG PO TABS: 20.0000 mg | ORAL_TABLET | Freq: Every day | ORAL | 1 refills | Status: DC

## 2018-09-10 MED ORDER — LISINOPRIL 5 MG PO TABS: 5.0000 mg | ORAL_TABLET | Freq: Every day | ORAL | 1 refills | Status: DC

## 2018-09-10 MED ORDER — GLIPIZIDE ER 10 MG PO TB24: 10.0000 mg | ORAL_TABLET | Freq: Every day | ORAL | 1 refills | Status: DC

## 2018-09-10 MED ORDER — GABAPENTIN 100 MG PO CAPS: 100.0000 mg | ORAL_CAPSULE | Freq: Three times a day (TID) | ORAL | 1 refills | Status: DC

## 2018-09-10 NOTE — Progress Notes (Signed)
Patient: Ashley Ochoa Female    DOB: [birth year]   52 y.o.   MRN: [id number] Visit Date: 09/10/2018  Today's Provider: [facility]   Chief Complaint  Patient presents with  . Follow-up   Subjective:    HPI  Ashley Ochoa is a 52 year old female with a past medical history of Hypertension and Diabetes. She is here today for follow up.   Current Status: Since her last office visit, she is doing well with no complaints. She continues to struggle with weight loss.  She denies visual changes, chest pain, cough, shortness of breath, heart palpitations, and falls. She has occasionally headaches and dizziness with position changes. Denies severe headaches, confusion, seizures, double vision, and blurred vision, nausea and vomiting. She denies increased thirst, frequent urination, hunger, fatigue, blurred vision, excessive hunger, excessive thirst, weight gain, weight loss, and poor wound healing.   She denies fevers, chills, fatigue, recent infections, weight loss, and night sweats. No reports of GI problems such as nausea, vomiting, diarrhea, and constipation. She has no reports of blood in stools, dysuria and hematuria. No depression or anxiety, and denies suicidal ideations, homicidal ideations, or auditory hallucinations. She denies pain today.   Past Medical History:  Diagnosis Date  . Diabetes mellitus without complication (HCC)   . Hypertension     No family history on file.   Social History   Socioeconomic History  . Marital status: Single    Spouse name: Not on file  . Number of children: Not on file  . Years of education: Not on file  . Highest education level: Not on file  Occupational History  . Occupation: CNA  Social Needs  . Financial resource strain: Patient refused  . Food insecurity:    Worry: Never true    Inability: Never true  . Transportation needs:    Medical: No    Non-medical: No  Tobacco Use  . Smoking status: Former Smoker    Packs/day: 0.50   Types: Cigarettes    Last attempt to quit: 11/24/2017    Years since quitting: 0.7  . Smokeless tobacco: Never Used  Substance and Sexual Activity  . Alcohol use: Yes    Alcohol/week: 2.0 standard drinks    Types: 2 Standard drinks or equivalent per week    Comment: pt states she drinks on occasion roughly 2 times per week  . Drug use: Not on file  . Sexual activity: Not on file  Lifestyle  . Physical activity:    Days per week: Patient refused    Minutes per session: Patient refused  . Stress: Not on file  Relationships  . Social connections:    Talks on phone: Patient refused    Gets together: Patient refused    Attends religious service: Patient refused    Active member of club or organization: Patient refused    Attends meetings of clubs or organizations: Patient refused    Relationship status: Patient refused  . Intimate partner violence:    Fear of current or ex partner: Patient refused    Emotionally abused: Patient refused    Physically abused: Patient refused    Forced sexual activity: Patient refused  Other Topics Concern  . Not on file  Social History Narrative   Patient refused questioning.    Past Surgical History:  Procedure Laterality Date  . ABDOMINAL HYSTERECTOMY     partial     No Known Allergies Previous Medications   ASPIRIN EC 81 MG TABLET  Take 81 mg by mouth daily.   GABAPENTIN (NEURONTIN) 100 MG CAPSULE    Take 1 capsule (100 mg total) by mouth 3 (three) times daily.   GLIPIZIDE (GLUCOTROL XL) 10 MG 24 HR TABLET    Take 1 tablet (10 mg total) by mouth daily.   LISINOPRIL (PRINIVIL,ZESTRIL) 5 MG TABLET    Take 1 tablet (5 mg total) by mouth daily.   METFORMIN (GLUCOPHAGE) 1000 MG TABLET    Take 1 tablet (1,000 mg total) by mouth 2 (two) times daily with a meal.   SIMVASTATIN (ZOCOR) 20 MG TABLET    Take 1 tablet (20 mg total) by mouth daily.   Review of Systems  Constitutional: Negative.   HENT: Negative.   Eyes: Negative.   Respiratory:  Negative.   Cardiovascular: Negative.   Gastrointestinal: Negative.   Genitourinary: Negative.   Musculoskeletal: Negative.   Skin: Negative.   Neurological: Negative.   Hematological: Negative.   Psychiatric/Behavioral: Negative.    Social History   Tobacco Use  . Smoking status: Former Smoker    Packs/day: 0.50    Types: Cigarettes    Last attempt to quit: 11/24/2017    Years since quitting: 0.7  . Smokeless tobacco: Never Used  Substance Use Topics  . Alcohol use: Yes    Alcohol/week: 2.0 standard drinks    Types: 2 Standard drinks or equivalent per week    Comment: pt states she drinks on occasion roughly 2 times per week   Objective:   BP 97/71 (BP Location: Left Arm, Patient Position: Sitting, Cuff Size: Large)   Pulse 78   Temp 97.8 F (36.6 C)   Ht 5' 0.5" (1.537 m)   Wt 214 lb 1.6 oz (97.1 kg)   BMI 41.13 kg/m   Physical Exam  Constitutional: She is oriented to person, place, and time. She appears well-developed and well-nourished.  Eyes: Pupils are equal, round, and reactive to light. EOM are normal.  Neck: Normal range of motion. Neck supple.  Cardiovascular: Normal rate, regular rhythm, normal heart sounds and intact distal pulses.  Pulmonary/Chest: Effort normal and breath sounds normal.  Abdominal: Soft. Bowel sounds are normal.  Musculoskeletal: Normal range of motion.  Neurological: She is alert and oriented to person, place, and time.  Skin: Skin is warm and dry.  Psychiatric: She has a normal mood and affect. Her behavior is normal. Judgment and thought content normal.  Nursing note and vitals reviewed.  Assessment & Plan:   1. Hyperlipidemia associated with type 2 diabetes mellitus (HCC) Lipid panel stable 04/2018. She will continue Simvastatin as prescribed. Continue to decrease high-fat foods.   2. Diabetes mellitus without complication (HCC) Hgb A1c is stable at 7.7. She will continue to decrease foods/beverages high in sugars and carbs and  follow Heart Healthy or DASH diet. Increase physical activity to at least 30 minutes cardio exercise daily.   3. Essential hypertension Antihypertensive medication is effective. Blood pressure is 97/71 today. Continue Lisinopril as prescribed. She will continue to decrease high sodium intake, excessive alcohol intake, increase potassium intake, smoking cessation, and increase physical activity of at least 30 minutes of cardio activity daily. She will continue to follow Heart Healthy or DASH diet.  4. Neuropathy Stable. Not worsening. Continue Gabapentin as prescribed.   5. Class 3 severe obesity due to excess calories with serious comorbidity and body mass index (BMI) of 40.0 to 44.9 in adult [facility]) Body mass index is 41.13 kg/m. Goal BMI  is <30. Encouraged efforts  to reduce weight include engaging in physical activity as tolerated with goal of 150 minutes per week. Improve dietary choices and eat a meal regimen consistent with a Mediterranean or DASH diet. Reduce simple carbohydrates. Do not skip meals and eat healthy snacks throughout the day to avoid over-eating at dinner. Set a goal weight loss that is achievable for you.  6. Follow up She will follow up in 3 month.   Meds ordered this encounter  Medications  . gabapentin (NEURONTIN) 100 MG capsule    Sig: Take 1 capsule (100 mg total) by mouth 3 (three) times daily.    Dispense:  90 capsule    Refill:  1  . glipiZIDE (GLUCOTROL XL) 10 MG 24 hr tablet    Sig: Take 1 tablet (10 mg total) by mouth daily.    Dispense:  90 tablet    Refill:  1  . lisinopril (PRINIVIL,ZESTRIL) 5 MG tablet    Sig: Take 1 tablet (5 mg total) by mouth daily.    Dispense:  90 tablet    Refill:  1  . metFORMIN (GLUCOPHAGE) 1000 MG tablet    Sig: Take 1 tablet (1,000 mg total) by mouth 2 (two) times daily with a meal.    Dispense:  60 tablet    Refill:  6  . simvastatin (ZOCOR) 20 MG tablet    Sig: Take 1 tablet (20 mg total) by mouth daily.    Dispense:   90 tablet    Refill:  1    Raliegh Ip,  MSN, FNP-C [facility] [address] [place], [place] [address] [phone]

## 2018-12-10 ENCOUNTER — Ambulatory Visit: Payer: Self-pay

## 2018-12-17 ENCOUNTER — Encounter: Payer: Self-pay | Admitting: Adult Health Nurse Practitioner

## 2018-12-17 ENCOUNTER — Ambulatory Visit: Payer: Self-pay | Admitting: Adult Health Nurse Practitioner

## 2018-12-17 VITALS — BP 104/75 | HR 101 | Temp 97.9°F | Ht 61.0 in | Wt 218.1 lb

## 2018-12-17 DIAGNOSIS — E785 Hyperlipidemia, unspecified: Secondary | ICD-10-CM

## 2018-12-17 DIAGNOSIS — E1140 Type 2 diabetes mellitus with diabetic neuropathy, unspecified: Secondary | ICD-10-CM

## 2018-12-17 DIAGNOSIS — E1169 Type 2 diabetes mellitus with other specified complication: Secondary | ICD-10-CM

## 2018-12-17 DIAGNOSIS — I10 Essential (primary) hypertension: Secondary | ICD-10-CM

## 2018-12-17 MED ORDER — GABAPENTIN 300 MG PO CAPS: 300.0000 mg | ORAL_CAPSULE | Freq: Three times a day (TID) | ORAL | 2 refills | Status: DC

## 2018-12-17 MED ORDER — SIMVASTATIN 20 MG PO TABS: 20.0000 mg | ORAL_TABLET | Freq: Every day | ORAL | 1 refills | Status: DC

## 2018-12-17 MED ORDER — LISINOPRIL 5 MG PO TABS: 5.0000 mg | ORAL_TABLET | Freq: Every day | ORAL | 1 refills | Status: DC

## 2018-12-17 MED ORDER — METFORMIN HCL 1000 MG PO TABS: 1000.0000 mg | ORAL_TABLET | Freq: Two times a day (BID) | ORAL | 6 refills | Status: DC

## 2018-12-17 MED ORDER — GLIPIZIDE ER 10 MG PO TB24: 10.0000 mg | ORAL_TABLET | Freq: Every day | ORAL | 1 refills | Status: DC

## 2018-12-17 NOTE — Progress Notes (Signed)
Patient: Ashley Ochoa Female    DOB: 1966/04/24   53 y.o.   MRN: [id number] Visit Date: 12/17/2018  Today's Provider: Jacelyn Pi, NP   Chief Complaint  Patient presents with  . Follow-up    quick, sharp pains in mid-back; still having tingling in feet and hands   Subjective:    HPI   Last A1c was 7.7. States that she is trying to eat healthier- not exercising.   Takes gabapentin for neuropathy. Was taking 200mg  3 times daily to obtain any relief. Denies gabapentin making her drowsy.   Pt states that she gets sharp pain on the left and right side of her back intermittent x 2-3 weeks 3 x per week. States that she stands up to walk and it would take her breath and stop her in her tracks. Eased up when she stretched. Has not taken any medications for it.   Years since last mammogram.     No Known Allergies Previous Medications   ASPIRIN EC 81 MG TABLET    Take 81 mg by mouth daily.   GABAPENTIN (NEURONTIN) 100 MG CAPSULE    Take 1 capsule (100 mg total) by mouth 3 (three) times daily.   GLIPIZIDE (GLUCOTROL XL) 10 MG 24 HR TABLET    Take 1 tablet (10 mg total) by mouth daily.   LISINOPRIL (PRINIVIL,ZESTRIL) 5 MG TABLET    Take 1 tablet (5 mg total) by mouth daily.   METFORMIN (GLUCOPHAGE) 1000 MG TABLET    Take 1 tablet (1,000 mg total) by mouth 2 (two) times daily with a meal.   SIMVASTATIN (ZOCOR) 20 MG TABLET    Take 1 tablet (20 mg total) by mouth daily.    Review of Systems  All other systems reviewed and are negative.   Social History   Tobacco Use  . Smoking status: Former Smoker    Packs/day: 0.50    Types: Cigarettes    Last attempt to quit: 11/24/2017    Years since quitting: 1.0  . Smokeless tobacco: Never Used  Substance Use Topics  . Alcohol use: Yes    Alcohol/week: 2.0 standard drinks    Types: 2 Standard drinks or equivalent per week    Comment: pt states she drinks on occasion roughly 2 times per week   Objective:   BP 104/75 (BP Location: Left  Arm, Patient Position: Sitting, Cuff Size: Normal)   Pulse (!) 101   Temp 97.9 F (36.6 C) (Oral)   Ht 5\' 1"  (1.549 m)   Wt 218 lb 1.6 oz (98.9 kg)   BMI 41.21 kg/m   Physical Exam Vitals signs reviewed.  Constitutional:      Appearance: Normal appearance.  HENT:     Head: Normocephalic and atraumatic.  Neck:     Musculoskeletal: Normal range of motion and neck supple.  Cardiovascular:     Rate and Rhythm: Normal rate and regular rhythm.  Pulmonary:     Effort: Pulmonary effort is normal.     Breath sounds: Normal breath sounds.  Abdominal:     General: Bowel sounds are normal.     Palpations: Abdomen is soft.  Skin:    General: Skin is warm and dry.  Neurological:     Mental Status: She is alert.         Assessment & Plan:       DM:   Encourage diabetic diet and exercise.  Continue current medication regimen.  Increase gabapentin to 300mg  TID for neuropathy.  FU  in 4 weeks for lab review and medication effectiveness.   HTN:  Controlled.   Goal BP <130/80.  Continue current medication regimen.  Encourage low salt diet and exercise.   HLD:  Controlled.  Continue current regimen.  Encourage low cholesterol, low fat diet and exercise.    [facility]   Routine labs ordered tonight.       Jacelyn Pieah Doles-Johnson, NP   [facility]

## 2018-12-18 LAB — LIPID PANEL
Chol/HDL Ratio: 3.1 ratio (ref 0.0–4.4)
Cholesterol, Total: 166 mg/dL (ref 100–199)
HDL: 53 mg/dL (ref >39)
LDL Calculated: 60 mg/dL (ref 0–99)
Triglycerides: 266 mg/dL — ABNORMAL HIGH (ref 0–149)
VLDL Cholesterol Cal: 53 mg/dL — ABNORMAL HIGH (ref 5–40)

## 2018-12-18 LAB — CBC
Hematocrit: 43.5 % (ref 34.0–46.6)
Hemoglobin: 14.5 g/dL (ref 11.1–15.9)
MCH: 30.3 pg (ref 26.6–33.0)
MCHC: 33.3 g/dL (ref 31.5–35.7)
MCV: 91 fL (ref 79–97)
Platelets: 429 10*3/uL (ref 150–450)
RBC: 4.79 x10E6/uL (ref 3.77–5.28)
RDW: 12.5 % (ref 11.7–15.4)
WBC: 11 10*3/uL — ABNORMAL HIGH (ref 3.4–10.8)

## 2018-12-18 LAB — COMPREHENSIVE METABOLIC PANEL
ALT: 23 IU/L (ref 0–32)
AST: 15 IU/L (ref 0–40)
Albumin/Globulin Ratio: 1.6 (ref 1.2–2.2)
Albumin: 4.3 g/dL (ref 3.5–5.5)
Alkaline Phosphatase: 104 IU/L (ref 39–117)
BUN/Creatinine Ratio: 19 (ref 9–23)
BUN: 16 mg/dL (ref 6–24)
Bilirubin Total: <0.2 mg/dL (ref 0.0–1.2)
CO2: 21 mmol/L (ref 20–29)
Calcium: 9.9 mg/dL (ref 8.7–10.2)
Chloride: 100 mmol/L (ref 96–106)
Creatinine, Ser: 0.83 mg/dL (ref 0.57–1.00)
GFR calc non Af Amer: 81 mL/min/{1.73_m2} (ref >59)
GFR, EST AFRICAN AMERICAN: 93 mL/min/{1.73_m2} (ref >59)
Globulin, Total: 2.7 g/dL (ref 1.5–4.5)
Sodium: 142 mmol/L (ref 134–144)
TOTAL PROTEIN: 7 g/dL (ref 6.0–8.5)

## 2018-12-18 LAB — HEMOGLOBIN A1C
Est. average glucose Bld gHb Est-mCnc: 171 mg/dL
HEMOGLOBIN A1C: 7.6 % — AB (ref 4.8–5.6)

## 2018-12-18 LAB — MICROALBUMIN / CREATININE URINE RATIO
CREATININE, UR: 254.2 mg/dL
Microalb/Creat Ratio: 3.5 mg/g creat (ref 0.0–30.0)
Microalbumin, Urine: 8.8 ug/mL

## 2019-01-14 ENCOUNTER — Ambulatory Visit: Payer: Self-pay | Admitting: Family Medicine

## 2019-01-14 VITALS — BP 107/75 | HR 83 | Temp 98.0°F | Ht 61.0 in | Wt 220.9 lb

## 2019-01-14 DIAGNOSIS — E669 Obesity, unspecified: Secondary | ICD-10-CM

## 2019-01-14 DIAGNOSIS — R8290 Unspecified abnormal findings in urine: Secondary | ICD-10-CM

## 2019-01-14 DIAGNOSIS — I10 Essential (primary) hypertension: Secondary | ICD-10-CM

## 2019-01-14 DIAGNOSIS — E1169 Type 2 diabetes mellitus with other specified complication: Secondary | ICD-10-CM

## 2019-01-14 MED ORDER — NITROFURANTOIN MONOHYD MACRO 100 MG PO CAPS: 100.0000 mg | ORAL_CAPSULE | Freq: Two times a day (BID) | ORAL | 0 refills | Status: DC

## 2019-01-14 NOTE — Patient Instructions (Addendum)
Check out the information at familydoctor.org entitled "Nutrition for Weight Loss: What You Need to Know about Fad Diets" Try to lose between 1-2 pounds per week by taking in fewer calories and burning off more calories You can succeed by limiting portions, limiting foods dense in calories and fat, becoming more active, and drinking 8 glasses of water a day (64 ounces) Don't skip meals, especially breakfast, as skipping meals may alter your metabolism Do not use over-the-counter weight loss pills or gimmicks that claim rapid weight loss A healthy BMI (or body mass index) is between 18.5 and 24.9 You can calculate your ideal BMI at the NIH website JobEconomics.hu  Let's check urine today Start the antibiotic while we're waiting on urine results  Obesity, Adult Obesity is the condition of having too much total body fat. Being overweight or obese means that your weight is greater than what is considered healthy for your body size. Obesity is determined by a measurement called BMI. BMI is an estimate of body fat and is calculated from height and weight. For adults, a BMI of 30 or higher is considered obese. Obesity can eventually lead to other health concerns and major illnesses, including:  Stroke.  Coronary artery disease (CAD).  Type 2 diabetes.  Some types of cancer, including cancers of the colon, breast, uterus, and gallbladder.  Osteoarthritis.  High blood pressure (hypertension).  High cholesterol.  Sleep apnea.  Gallbladder stones.  Infertility problems. What are the causes? The main cause of obesity is taking in (consuming) more calories than your body uses for energy. Other factors that contribute to this condition may include:  Being born with genes that make you more likely to become obese.  Having a medical condition that causes obesity. These conditions include: ? Hypothyroidism. ? Polycystic ovarian syndrome  (PCOS). ? Binge-eating disorder. ? Cushing syndrome.  Taking certain medicines, such as steroids, antidepressants, and seizure medicines.  Not being physically active (sedentary lifestyle).  Living where there are limited places to exercise safely or buy healthy foods.  Not getting enough sleep. What increases the risk? The following factors may increase your risk of this condition:  Having a family history of obesity.  Being a woman of African-American descent.  Being a man of Hispanic descent. What are the signs or symptoms? Having excessive body fat is the main symptom of this condition. How is this diagnosed? This condition may be diagnosed based on:  Your symptoms.  Your medical history.  A physical exam. Your health care provider may measure: ? Your BMI. If you are an adult with a BMI between 25 and less than 30, you are considered overweight. If you are an adult with a BMI of 30 or higher, you are considered obese. ? The distances around your hips and your waist (circumferences). These may be compared to each other to help diagnose your condition. ? Your skinfold thickness. Your health care provider may gently pinch a fold of your skin and measure it. How is this treated? Treatment for this condition often includes changing your lifestyle. Treatment may include some or all of the following:  Dietary changes. Work with your health care provider and a dietitian to set a weight-loss goal that is healthy and reasonable for you. Dietary changes may include eating: ? Smaller portions. A portion size is the amount of a particular food that is healthy for you to eat at one time. This varies from person to person. ? Low-calorie or low-fat options. ? More whole grains,  fruits, and vegetables.  Regular physical activity. This may include aerobic activity (cardio) and strength training.  Medicine to help you lose weight. Your health care provider may prescribe medicine if you are  unable to lose 1 pound a week after 6 weeks of eating more healthily and doing more physical activity.  Surgery. Surgical options may include gastric banding and gastric bypass. Surgery may be done if: ? Other treatments have not helped to improve your condition. ? You have a BMI of 40 or higher. ? You have life-threatening health problems related to obesity. Follow these instructions at home:  Eating and drinking   Follow recommendations from your health care provider about what you eat and drink. Your health care provider may advise you to: ? Limit fast foods, sweets, and processed snack foods. ? Choose low-fat options, such as low-fat milk instead of whole milk. ? Eat 5 or more servings of fruits or vegetables every day. ? Eat at home more often. This gives you more control over what you eat. ? Choose healthy foods when you eat out. ? Learn what a healthy portion size is. ? Keep low-fat snacks on hand. ? Avoid sugary drinks, such as soda, fruit juice, iced tea sweetened with sugar, and flavored milk. ? Eat a healthy breakfast.  Drink enough water to keep your urine clear or pale yellow.  Do not go without eating for long periods of time (do not fast) or follow a fad diet. Fasting and fad diets can be unhealthy and even dangerous. Physical Activity  Exercise regularly, as told by your health care provider. Ask your health care provider what types of exercise are safe for you and how often you should exercise.  Warm up and stretch before being active.  Cool down and stretch after being active.  Rest between periods of activity. Lifestyle  Limit the time that you spend in front of your TV, computer, or video game system.  Find ways to reward yourself that do not involve food.  Limit alcohol intake to no more than 1 drink a day for nonpregnant women and 2 drinks a day for men. One drink equals 12 oz of beer, 5 oz of wine, or 1 oz of hard liquor. General instructions  Keep a  weight loss journal to keep track of the food you eat and how much you exercise you get.  Take over-the-counter and prescription medicines only as told by your health care provider.  Take vitamins and supplements only as told by your health care provider.  Consider joining a support group. Your health care provider may be able to recommend a support group.  Keep all follow-up visits as told by your health care provider. This is important. Contact a health care provider if:  You are unable to meet your weight loss goal after 6 weeks of dietary and lifestyle changes. This information is not intended to replace advice given to you by your health care provider. Make sure you discuss any questions you have with your health care provider. Document Released: 12/28/2004 Document Revised: 04/24/2016 Document Reviewed: 09/08/2015 Elsevier Interactive Patient Education  2019 Elsevier Inc.  Preventing Unhealthy [employer] EnergyWeight Gain, Adult Staying at a healthy weight is important to your overall health. When fat builds up in your body, you may become overweight or obese. Being overweight or obese increases your risk of developing certain health problems, such as heart disease, diabetes, sleeping problems, joint problems, and some types of cancer. Unhealthy weight gain is often the result  of making unhealthy food choices or not getting enough exercise. You can make changes to your lifestyle to prevent obesity and stay as healthy as possible. What nutrition changes can be made?   Eat only as much as your body needs. To do this: ? Pay attention to signs that you are hungry or full. Stop eating as soon as you feel full. ? If you feel hungry, try drinking water first before eating. Drink enough water so your urine is clear or pale yellow. ? Eat smaller portions. Pay attention to portion sizes when eating out. ? Look at serving sizes on food labels. Most foods contain more than one serving per container. ? Eat the  recommended number of calories for your gender and activity level. For most active people, a daily total of 2,000 calories is appropriate. If you are trying to lose weight or are not very active, you may need to eat fewer calories. Talk with your health care provider or a diet and nutrition specialist (dietitian) about how many calories you need each day.  Choose healthy foods, such as: ? Fruits and vegetables. At each meal, try to fill at least half of your plate with fruits and vegetables. ? Whole grains, such as whole-wheat bread, brown rice, and quinoa. ? Lean meats, such as chicken or fish. ? Other healthy proteins, such as beans, eggs, or tofu. ? Healthy fats, such as nuts, seeds, fatty fish, and olive oil. ? Low-fat or fat-free dairy products.  Check food labels, and avoid food and drinks that: ? Are high in calories. ? Have added sugar. ? Are high in sodium. ? Have saturated fats or trans fats.  Cook foods in healthier ways, such as by baking, broiling, or grilling.  Make a meal plan for the week, and shop with a grocery list to help you stay on track with your purchases. Try to avoid going to the grocery store when you are hungry.  When grocery shopping, try to shop around the outside of the store first, where the fresh foods are. Doing this helps you to avoid prepackaged foods, which can be high in sugar, salt (sodium), and fat. What lifestyle changes can be made?   Exercise for 30 or more minutes on 5 or more days each week. Exercising may include brisk walking, yard work, biking, running, swimming, and team sports like basketball and soccer. Ask your health care provider which exercises are safe for you.  Do muscle-strengthening activities, such as lifting weights or using resistance bands, on 2 or more days a week.  Do not use any products that contain nicotine or tobacco, such as cigarettes and e-cigarettes. If you need help quitting, ask your health care provider.  Limit  alcohol intake to no more than 1 drink a day for nonpregnant women and 2 drinks a day for men. One drink equals 12 oz of beer, 5 oz of wine, or 1 oz of hard liquor.  Try to get 7-9 hours of sleep each night. What other changes can be made?  Keep a food and activity journal to keep track of: ? What you ate and how many calories you had. Remember to count the calories in sauces, dressings, and side dishes. ? Whether you were active, and what exercises you did. ? Your calorie, weight, and activity goals.  Check your weight regularly. Track any changes. If you notice you have gained weight, make changes to your diet or activity routine.  Avoid taking weight-loss medicines or supplements. Talk  to your health care provider before starting any new medicine or supplement.  Talk to your health care provider before trying any new diet or exercise plan. Why are these changes important? Eating healthy, staying active, and having healthy habits can help you to prevent obesity. Those changes also:  Help you manage stress and emotions.  Help you connect with friends and family.  Improve your self-esteem.  Improve your sleep.  Prevent long-term health problems. What can happen if changes are not made? Being obese or overweight can cause you to develop joint or bone problems, which can make it hard for you to stay active or do activities you enjoy. Being obese or overweight also puts stress on your heart and lungs and can lead to health problems like diabetes, heart disease, and some cancers. Where to find more information Talk with your health care provider or a dietitian about healthy eating and healthy lifestyle choices. You may also find information from:  U.S. Department of Agriculture, MyPlate: https://ball-collins.biz/  American Heart Association: www.heart.org  Centers for Disease Control and Prevention: FootballExhibition.com.br Summary  Staying at a healthy weight is important to your overall health.  It helps you to prevent certain diseases and health problems, such as heart disease, diabetes, joint problems, sleep disorders, and some types of cancer.  Being obese or overweight can cause you to develop joint or bone problems, which can make it hard for you to stay active or do activities you enjoy.  You can prevent unhealthy weight gain by eating a healthy diet, exercising regularly, not smoking, limiting alcohol, and getting enough sleep.  Talk with your health care provider or a dietitian for guidance about healthy eating and healthy lifestyle choices. This information is not intended to replace advice given to you by your health care provider. Make sure you discuss any questions you have with your health care provider. Document Released: 11/21/2016 Document Revised: 08/31/2017 Document Reviewed: 12/27/2016 Elsevier Interactive Patient Education  2019 [employer].

## 2019-01-14 NOTE — Progress Notes (Signed)
BP 107/75 (BP Location: Left Arm, Patient Position: Sitting, Cuff Size: Normal)   Pulse 83   Temp 98 F (36.7 C) (Oral)   Ht _0  (1.549 m)   Wt 220 lb 14.4 oz (100.2 kg)   BMI 41.74 kg/m    Subjective:    Patient ID: Ashley Ochoa, female    DOB: Apr 19, 1966, 53 y.o.   MRN: [id number]  HPI: Ashley Ochoa is a 53 y.o. female  Chief Complaint  Patient presents with  . Follow-up    No new health concerns since last visit    HPI Here for f/u Had labs done last time and we're going over them today  TG were a little high; LDL excellent; HDL 53  A1c 7.6 Not taking metformin 1000 mg as written, just once a day instead of twice a day Loves bread, had a bagel  Some discomfort in the left kidney; catch in the back Both sides Few fevers in the last two weeks; no burning; strong odor; has to be  No flowsheet data found. No flowsheet data found.  Relevant past medical, surgical, family and social history reviewed Past Medical History:  Diagnosis Date  . Diabetes mellitus without complication ([place])   . Hypertension    Past Surgical History:  Procedure Laterality Date  . ABDOMINAL HYSTERECTOMY     partial    No family history on file. Social History   Tobacco Use  . Smoking status: Former Smoker    Packs/day: 0.50    Types: Cigarettes    Last attempt to quit: 11/24/2017    Years since quitting: 1.1  . Smokeless tobacco: Never Used  Substance Use Topics  . Alcohol use: Yes    Alcohol/week: 2.0 standard drinks    Types: 2 Standard drinks or equivalent per week    Comment: pt states she drinks on occasion roughly 2 times per week  . Drug use: Not on file     Interim medical history since last visit reviewed. Allergies and medications reviewed  Review of Systems Per HPI unless specifically indicated above     Objective:    BP 107/75 (BP Location: Left Arm, Patient Position: Sitting, Cuff Size: Normal)   Pulse 83   Temp 98 F (36.7 C) (Oral)   Ht _1   (1.549 m)   Wt 220 lb 14.4 oz (100.2 kg)   BMI 41.74 kg/m   Wt Readings from Last 3 Encounters:  01/14/19 220 lb 14.4 oz (100.2 kg)  12/17/18 218 lb 1.6 oz (98.9 kg)  09/10/18 214 lb 1.6 oz (97.1 kg)    Physical Exam Constitutional:      General: She is not in acute distress.    Appearance: She is well-developed. She is not diaphoretic.  HENT:     Head: Normocephalic and atraumatic.  Eyes:     General: No scleral icterus. Neck:     Thyroid: No thyromegaly.  Cardiovascular:     Rate and Rhythm: Normal rate and regular rhythm.     Heart sounds: Normal heart sounds. No murmur.  Pulmonary:     Effort: Pulmonary effort is normal. No respiratory distress.     Breath sounds: Normal breath sounds. No wheezing.  Abdominal:     General: Bowel sounds are normal. There is no distension.     Palpations: Abdomen is soft.  Skin:    General: Skin is warm and dry.     Coloration: Skin is not pale.  Neurological:     Mental  Status: She is alert.  Psychiatric:        Behavior: Behavior normal.        Thought Content: Thought content normal.        Judgment: Judgment normal.    Results for orders placed or performed in visit on 12/17/18  Comp Met (CMET)  Result Value Ref Range   Glucose CANCELED mg/dL   BUN 16 6 - 24 mg/dL   Creatinine, Ser 0.83 0.57 - 1.00 mg/dL   GFR calc non Af Amer 81 >59 mL/min/1.73   GFR calc Af Amer 93 >59 mL/min/1.73   BUN/Creatinine Ratio 19 9 - 23   Sodium 142 134 - 144 mmol/L   Potassium CANCELED mmol/L   Chloride 100 96 - 106 mmol/L   CO2 21 20 - 29 mmol/L   Calcium 9.9 8.7 - 10.2 mg/dL   Total Protein 7.0 6.0 - 8.5 g/dL   Albumin 4.3 3.5 - 5.5 g/dL   Globulin, Total 2.7 1.5 - 4.5 g/dL   Albumin/Globulin Ratio 1.6 1.2 - 2.2   Bilirubin Total <0.2 0.0 - 1.2 mg/dL   Alkaline Phosphatase 104 39 - 117 IU/L   AST 15 0 - 40 IU/L   ALT 23 0 - 32 IU/L  Lipid Profile  Result Value Ref Range   Cholesterol, Total 166 100 - 199 mg/dL   Triglycerides 266  (H) 0 - 149 mg/dL   HDL 53 >39 mg/dL   VLDL Cholesterol Cal 53 (H) 5 - 40 mg/dL   LDL Calculated 60 0 - 99 mg/dL   Chol/HDL Ratio 3.1 0.0 - 4.4 ratio  HgB A1c  Result Value Ref Range   Hgb A1c MFr Bld 7.6 (H) 4.8 - 5.6 %   Est. average glucose Bld gHb Est-mCnc 171 mg/dL  CBC  Result Value Ref Range   WBC 11.0 (H) 3.4 - 10.8 x10E3/uL   RBC 4.79 3.77 - 5.28 x10E6/uL   Hemoglobin 14.5 11.1 - 15.9 g/dL   Hematocrit 43.5 34.0 - 46.6 %   MCV 91 79 - 97 fL   MCH 30.3 26.6 - 33.0 pg   MCHC 33.3 31.5 - 35.7 g/dL   RDW 12.5 11.7 - 15.4 %   Platelets 429 150 - 450 x10E3/uL  Urine Microalbumin w/creat. ratio  Result Value Ref Range   Creatinine, Urine 254.2 Not Estab. mg/dL   Microalbumin, Urine 8.8 Not Estab. ug/mL   Microalb/Creat Ratio 3.5 0.0 - 30.0 mg/g creat      Assessment & Plan:   Problem List Items Addressed This Visit      Cardiovascular and Mediastinum   Hypertension    Controlled today        Endocrine   Diabetes mellitus type 2 in obese ([place])    Encouraged healthy eating, weight control; metformin BID       Other Visit Diagnoses    Abnormal urine odor    -  Primary   Relevant Orders   Urinalysis w microscopic + reflex cultur   Urinalysis   Urine Culture       Follow up plan: Return in about 2 months (around 03/19/2019) for diabetes, labs that night too.  An after-visit summary was printed and given to the patient at [facility].  Please see the patient instructions which may contain other information and recommendations beyond what is mentioned above in the assessment and plan.  Meds ordered this encounter  Medications  . nitrofurantoin, macrocrystal-monohydrate, (MACROBID) 100 MG capsule    Sig: Take 1  capsule (100 mg total) by mouth 2 (two) times daily.    Dispense:  14 capsule    Refill:  0    Orders Placed This Encounter  Procedures  . Urine Culture  . Urinalysis w microscopic + reflex cultur  . Urinalysis

## 2019-01-16 NOTE — Assessment & Plan Note (Signed)
Encouraged healthy eating, weight loss 

## 2019-01-16 NOTE — Assessment & Plan Note (Signed)
Controlled today 

## 2019-01-16 NOTE — Assessment & Plan Note (Signed)
Encouraged healthy eating, weight control; metformin BID

## 2019-01-22 LAB — URINALYSIS
Bilirubin, UA: NEGATIVE
Glucose, UA: NEGATIVE
Ketones, UA: NEGATIVE
Leukocytes, UA: NEGATIVE
NITRITE UA: NEGATIVE
Protein, UA: NEGATIVE
RBC UA: NEGATIVE
Specific Gravity, UA: 1.022 (ref 1.005–1.030)
UUROB: 0.2 mg/dL (ref 0.2–1.0)
pH, UA: 5 (ref 5.0–7.5)

## 2019-01-22 LAB — URINE CULTURE

## 2019-03-13 ENCOUNTER — Ambulatory Visit: Payer: Self-pay | Admitting: Adult Health Nurse Practitioner

## 2019-03-13 DIAGNOSIS — E1169 Type 2 diabetes mellitus with other specified complication: Secondary | ICD-10-CM

## 2019-03-13 DIAGNOSIS — E785 Hyperlipidemia, unspecified: Secondary | ICD-10-CM

## 2019-03-13 DIAGNOSIS — E669 Obesity, unspecified: Principal | ICD-10-CM

## 2019-03-13 NOTE — Progress Notes (Signed)
  Patient: Ashley Ochoa Female    DOB: [birth year]   53 y.o.   MRN: [id number] Visit Date: 03/13/2019  Today's Provider: [facility]   No chief complaint on file.  Subjective:    HPI  Telephonic visit.   Taking all medications as directed.   Pt states that she stumped her R great toe a few days a it is causing her pain- states she is taking 600mg  ibuprofen with good relief. Denies redness, warmth or edema.     No Known Allergies Previous Medications   ASPIRIN EC 81 MG TABLET    Take 81 mg by mouth daily.   GABAPENTIN (NEURONTIN) 300 MG CAPSULE    Take 1 capsule (300 mg total) by mouth 3 (three) times daily.   GLIPIZIDE (GLUCOTROL XL) 10 MG 24 HR TABLET    Take 1 tablet (10 mg total) by mouth daily.   LISINOPRIL (PRINIVIL,ZESTRIL) 5 MG TABLET    Take 1 tablet (5 mg total) by mouth daily.   METFORMIN (GLUCOPHAGE) 1000 MG TABLET    Take 1 tablet (1,000 mg total) by mouth 2 (two) times daily with a meal.   NITROFURANTOIN, MACROCRYSTAL-MONOHYDRATE, (MACROBID) 100 MG CAPSULE    Take 1 capsule (100 mg total) by mouth 2 (two) times daily.   SIMVASTATIN (ZOCOR) 20 MG TABLET    Take 1 tablet (20 mg total) by mouth daily.    Review of Systems  All other systems reviewed and are negative.   Social History   Tobacco Use  . Smoking status: Former Smoker    Packs/day: 0.50    Types: Cigarettes    Last attempt to quit: 11/24/2017    Years since quitting: 1.2  . Smokeless tobacco: Never Used  Substance Use Topics  . Alcohol use: Yes    Alcohol/week: 2.0 standard drinks    Types: 2 Standard drinks or equivalent per week    Comment: pt states she drinks on occasion roughly 2 times per week   Objective:   There were no vitals taken for this visit.  Physical Exam  No PE    Assessment & Plan:         Take ibuprofen 600mg  q 8 hours x 3 days for toe pain.  FU as needed for worsening pain- consider xray.   HTN:   Goal BP <140/90.  Continue current medication regimen.   Encourage low salt diet and exercise.   DM:  Last A1c 7.6 in January. Encourage diabetic diet and exercise.  Continue current medication regimen.   HLD:  Continue current regimen.  Encourage low cholesterol, low fat diet and exercise.       [facility]   [facility]

## 2019-03-18 ENCOUNTER — Ambulatory Visit: Payer: Self-pay

## 2019-06-05 ENCOUNTER — Other Ambulatory Visit: Payer: Self-pay

## 2019-06-05 DIAGNOSIS — E669 Obesity, unspecified: Secondary | ICD-10-CM

## 2019-06-05 DIAGNOSIS — E1169 Type 2 diabetes mellitus with other specified complication: Secondary | ICD-10-CM

## 2019-06-06 LAB — LIPID PANEL
Chol/HDL Ratio: 2.9 ratio (ref 0.0–4.4)
Cholesterol, Total: 172 mg/dL (ref 100–199)
HDL: 59 mg/dL
LDL Calculated: 90 mg/dL (ref 0–99)
Triglycerides: 114 mg/dL (ref 0–149)
VLDL Cholesterol Cal: 23 mg/dL (ref 5–40)

## 2019-06-06 LAB — COMPREHENSIVE METABOLIC PANEL
ALT: 16 IU/L (ref 0–32)
AST: 10 IU/L (ref 0–40)
Albumin/Globulin Ratio: 1.7 (ref 1.2–2.2)
Albumin: 4.3 g/dL (ref 3.8–4.9)
Alkaline Phosphatase: 87 IU/L (ref 39–117)
BUN/Creatinine Ratio: 22 (ref 9–23)
BUN: 16 mg/dL (ref 6–24)
Bilirubin Total: 0.3 mg/dL (ref 0.0–1.2)
CO2: 24 mmol/L (ref 20–29)
Calcium: 9.9 mg/dL (ref 8.7–10.2)
Chloride: 102 mmol/L (ref 96–106)
Creatinine, Ser: 0.73 mg/dL (ref 0.57–1.00)
GFR calc Af Amer: 108 mL/min/{1.73_m2} (ref >59)
GFR calc non Af Amer: 94 mL/min/{1.73_m2} (ref >59)
Globulin, Total: 2.6 g/dL (ref 1.5–4.5)
Glucose: 140 mg/dL — ABNORMAL HIGH (ref 65–99)
Potassium: 4.6 mmol/L (ref 3.5–5.2)
Sodium: 140 mmol/L (ref 134–144)
Total Protein: 6.9 g/dL (ref 6.0–8.5)

## 2019-06-06 LAB — HEMOGLOBIN A1C
Est. average glucose Bld gHb Est-mCnc: 197 mg/dL
Hgb A1c MFr Bld: 8.5 % — ABNORMAL HIGH (ref 4.8–5.6)

## 2019-06-12 ENCOUNTER — Other Ambulatory Visit: Payer: Self-pay

## 2019-06-12 ENCOUNTER — Ambulatory Visit: Payer: Self-pay | Admitting: Adult Health Nurse Practitioner

## 2019-06-12 DIAGNOSIS — E1140 Type 2 diabetes mellitus with diabetic neuropathy, unspecified: Secondary | ICD-10-CM

## 2019-06-12 DIAGNOSIS — I10 Essential (primary) hypertension: Secondary | ICD-10-CM

## 2019-06-12 DIAGNOSIS — E1169 Type 2 diabetes mellitus with other specified complication: Secondary | ICD-10-CM

## 2019-06-12 DIAGNOSIS — M25552 Pain in left hip: Secondary | ICD-10-CM

## 2019-06-12 MED ORDER — METFORMIN HCL 1000 MG PO TABS: 1000.0000 mg | ORAL_TABLET | Freq: Two times a day (BID) | ORAL | 6 refills | Status: AC

## 2019-06-12 MED ORDER — LISINOPRIL 5 MG PO TABS: 5.0000 mg | ORAL_TABLET | Freq: Every day | ORAL | 1 refills | Status: AC

## 2019-06-12 MED ORDER — GLIPIZIDE ER 10 MG PO TB24: 10.0000 mg | ORAL_TABLET | Freq: Every day | ORAL | 1 refills | Status: DC

## 2019-06-12 MED ORDER — SIMVASTATIN 20 MG PO TABS: 20.0000 mg | ORAL_TABLET | Freq: Every day | ORAL | 1 refills | Status: AC

## 2019-06-12 NOTE — Progress Notes (Signed)
  Patient: Ashley Ochoa Female    DOB: 06-Aug-1966   53 y.o.   MRN: [id number] Visit Date: 06/12/2019  Today's Provider: [facility]   No chief complaint on file.  Subjective:    HPI   Telephonic visit.   A1c is up to 8.5 form 7.6.  Sometimes she forgets to take her evening dose of metformin.  States that when her sugar gets high she feels sleepy.  States that her CBG averages 110-160.  States that her appetite is diminished.  States that she is is only taking the glipizide as needed when her sugar is high.   LDL- 90.  Taking Zocor.   Taking gabapentin as needed- not 3x daily.   Pt states that her she wants an xray of her left hip- she states it hurts to lay on her left side. She states that she had a fall months back where her "leg came out from under me" and the pain started then and has continued to get worse.  Sometimes bothers her to walk. States she is sleeping with a pillow between her legs to relieve some pressure and it does help.      No Known Allergies Previous Medications   ASPIRIN EC 81 MG TABLET    Take 81 mg by mouth daily.   GABAPENTIN (NEURONTIN) 300 MG CAPSULE    Take 1 capsule (300 mg total) by mouth 3 (three) times daily.   GLIPIZIDE (GLUCOTROL XL) 10 MG 24 HR TABLET    Take 1 tablet (10 mg total) by mouth daily.   LISINOPRIL (PRINIVIL,ZESTRIL) 5 MG TABLET    Take 1 tablet (5 mg total) by mouth daily.   METFORMIN (GLUCOPHAGE) 1000 MG TABLET    Take 1 tablet (1,000 mg total) by mouth 2 (two) times daily with a meal.   NITROFURANTOIN, MACROCRYSTAL-MONOHYDRATE, (MACROBID) 100 MG CAPSULE    Take 1 capsule (100 mg total) by mouth 2 (two) times daily.   SIMVASTATIN (ZOCOR) 20 MG TABLET    Take 1 tablet (20 mg total) by mouth daily.    Review of Systems  All other systems reviewed and are negative.   Social History   Tobacco Use  . Smoking status: Former Smoker    Packs/day: 0.50    Types: Cigarettes    Quit date: 11/24/2017    Years since  quitting: 1.5  . Smokeless tobacco: Never Used  Substance Use Topics  . Alcohol use: Yes    Alcohol/week: 2.0 standard drinks    Types: 2 Standard drinks or equivalent per week    Comment: pt states she drinks on occasion roughly 2 times per week   Objective:   There were no vitals taken for this visit.  Physical Exam  No PE.      Assessment & Plan:       HTN:   Goal BP <140/90.  Continue current medication regimen.  Encourage low salt diet and exercise.   DM:  Not controlled.  Encourage diabetic diet and exercise.  Continue current medication regimen.  Encouraged to take medications as directed.   HLD:  Continue current regimen.  Encourage low cholesterol, low fat diet and exercise.   L hip pain:  Will order x-ray.  Continue supportive care.    FU phone visit in 2 weeks to review x-ray.       [facility]

## 2019-06-20 ENCOUNTER — Other Ambulatory Visit: Payer: Self-pay

## 2019-06-20 ENCOUNTER — Inpatient Hospital Stay: Admit: 2019-06-20 | Payer: Commercial Managed Care - PPO

## 2019-06-20 ENCOUNTER — Ambulatory Visit
Admission: RE | Admit: 2019-06-20 | Discharge: 2019-06-20 | Disposition: A | Discharge status: Home / Self Care | Payer: Commercial Managed Care - PPO | Attending: Adult Health Nurse Practitioner | Admitting: Adult Health Nurse Practitioner

## 2019-06-23 ENCOUNTER — Other Ambulatory Visit: Payer: Self-pay

## 2019-06-23 DIAGNOSIS — M25552 Pain in left hip: Secondary | ICD-10-CM

## 2019-06-24 ENCOUNTER — Other Ambulatory Visit: Payer: Self-pay

## 2019-06-24 DIAGNOSIS — M25552 Pain in left hip: Secondary | ICD-10-CM

## 2019-06-26 ENCOUNTER — Ambulatory Visit: Payer: Self-pay | Admitting: Urology

## 2019-06-26 ENCOUNTER — Other Ambulatory Visit: Payer: Self-pay

## 2019-06-26 NOTE — Progress Notes (Signed)
Ashley Ochoa did not answer her phone for her appointment

## 2019-06-30 ENCOUNTER — Ambulatory Visit
Admission: RE | Admit: 2019-06-30 | Discharge: 2019-06-30 | Disposition: A | Discharge status: Home / Self Care | Payer: Managed Care, Other (non HMO) | Source: Ambulatory Visit | Attending: Adult Health Nurse Practitioner | Admitting: Adult Health Nurse Practitioner

## 2019-06-30 ENCOUNTER — Other Ambulatory Visit: Payer: Self-pay

## 2019-06-30 ENCOUNTER — Ambulatory Visit
Admission: RE | Admit: 2019-06-30 | Discharge: 2019-06-30 | Disposition: A | Discharge status: Home / Self Care | Payer: Managed Care, Other (non HMO) | Attending: Adult Health Nurse Practitioner | Admitting: Adult Health Nurse Practitioner

## 2019-06-30 DIAGNOSIS — M25552 Pain in left hip: Secondary | ICD-10-CM | POA: Insufficient documentation

## 2019-09-11 ENCOUNTER — Other Ambulatory Visit: Payer: Self-pay

## 2019-09-11 DIAGNOSIS — E1140 Type 2 diabetes mellitus with diabetic neuropathy, unspecified: Secondary | ICD-10-CM

## 2019-09-12 LAB — HEMOGLOBIN A1C
Est. average glucose Bld gHb Est-mCnc: 212 mg/dL
Hgb A1c MFr Bld: 9 % — ABNORMAL HIGH (ref 4.8–5.6)

## 2019-09-12 LAB — CBC
Hematocrit: 44 % (ref 34.0–46.6)
Hemoglobin: 14.7 g/dL (ref 11.1–15.9)
MCH: 30.4 pg (ref 26.6–33.0)
MCHC: 33.4 g/dL (ref 31.5–35.7)
MCV: 91 fL (ref 79–97)
Platelets: 410 10*3/uL (ref 150–450)
RBC: 4.84 x10E6/uL (ref 3.77–5.28)
RDW: 12.9 % (ref 11.7–15.4)
WBC: 11.1 10*3/uL — ABNORMAL HIGH (ref 3.4–10.8)

## 2019-09-12 LAB — COMPREHENSIVE METABOLIC PANEL
ALT: 17 IU/L (ref 0–32)
AST: 16 IU/L (ref 0–40)
Albumin/Globulin Ratio: 1.6 (ref 1.2–2.2)
Albumin: 4.4 g/dL (ref 3.8–4.9)
Alkaline Phosphatase: 111 IU/L (ref 39–117)
BUN/Creatinine Ratio: 20 (ref 9–23)
BUN: 14 mg/dL (ref 6–24)
Bilirubin Total: 0.4 mg/dL (ref 0.0–1.2)
CO2: 24 mmol/L (ref 20–29)
Calcium: 9.8 mg/dL (ref 8.7–10.2)
Chloride: 102 mmol/L (ref 96–106)
Creatinine, Ser: 0.7 mg/dL (ref 0.57–1.00)
GFR calc Af Amer: 114 mL/min/{1.73_m2} (ref >59)
GFR calc non Af Amer: 99 mL/min/{1.73_m2} (ref >59)
Globulin, Total: 2.7 g/dL (ref 1.5–4.5)
Glucose: 160 mg/dL — ABNORMAL HIGH (ref 65–99)
Potassium: 4.2 mmol/L (ref 3.5–5.2)
Sodium: 141 mmol/L (ref 134–144)
Total Protein: 7.1 g/dL (ref 6.0–8.5)

## 2019-09-18 ENCOUNTER — Ambulatory Visit: Payer: Self-pay

## 2019-09-22 ENCOUNTER — Telehealth: Payer: Self-pay | Admitting: Gerontology

## 2019-09-22 NOTE — Telephone Encounter (Signed)
Called patient on 10/19 @ 9:40 am in regards to rescheduling missed appointment on 10/15. Rescheduled appointment for 11/5.

## 2019-10-09 ENCOUNTER — Ambulatory Visit: Payer: Self-pay | Admitting: Family Medicine

## 2019-10-09 ENCOUNTER — Other Ambulatory Visit: Payer: Self-pay

## 2019-10-09 ENCOUNTER — Encounter: Payer: Self-pay | Admitting: Family Medicine

## 2019-10-09 VITALS — BP 149/89 | HR 95 | Temp 97.3°F | Ht 61.0 in | Wt 222.0 lb

## 2019-10-09 DIAGNOSIS — G8929 Other chronic pain: Secondary | ICD-10-CM

## 2019-10-09 DIAGNOSIS — M25562 Pain in left knee: Secondary | ICD-10-CM

## 2019-10-09 DIAGNOSIS — H6992 Unspecified Eustachian tube disorder, left ear: Secondary | ICD-10-CM

## 2019-10-09 MED ORDER — CETIRIZINE HCL 10 MG PO CAPS: 10.0000 mg | ORAL_CAPSULE | Freq: Every day | ORAL | 0 refills | Status: AC

## 2019-10-09 MED ORDER — PREDNISONE 20 MG PO TABS: 20.0000 mg | ORAL_TABLET | Freq: Every day | ORAL | 0 refills | Status: AC

## 2019-10-09 MED ORDER — FLUTICASONE PROPIONATE 50 MCG/ACT NA SUSP: 1.0000 | Freq: Two times a day (BID) | NASAL | 2 refills | Status: AC

## 2019-10-09 MED ORDER — IBUPROFEN 800 MG PO TABS: 800.0000 mg | ORAL_TABLET | Freq: Three times a day (TID) | ORAL | 0 refills | Status: DC | PRN

## 2019-10-09 NOTE — Progress Notes (Signed)
Established Patient Office Visit  Subjective:  Patient ID: Ashley Ochoa, female    DOB: 06-Oct-1966  Age: 53 y.o. MRN: [id number]  CC:  Chief Complaint  Patient presents with  . Otitis Media  . Knee Pain    HPI Ashley Ochoa presents for follow up on DM2 and HTN. Patient's last A1C was 09/2019 and was 9.0. Patient states that sometimes she forgets to take the 2nd dose of metformin. She states that she does not check her blood glucose levels on a regular basis. She reports today that she is having left ear pain x 1 week. Left knee pain for several months that is worse with movement.   Past Medical History:  Diagnosis Date  . Diabetes mellitus without complication ([place])   . Hypertension     Past Surgical History:  Procedure Laterality Date  . ABDOMINAL HYSTERECTOMY     partial     No family history on file.  Social History   Socioeconomic History  . Marital status: Single    Spouse name: Not on file  . Number of children: Not on file  . Years of education: Not on file  . Highest education level: Not on file  Occupational History  . Occupation: CNA  Social Needs  . Financial resource strain: Patient refused  . Food insecurity    Worry: Never true    Inability: Never true  . Transportation needs    Medical: No    Non-medical: No  Tobacco Use  . Smoking status: Former Smoker    Packs/day: 0.50    Types: Cigarettes    Quit date: 11/24/2017    Years since quitting: 1.8  . Smokeless tobacco: Never Used  Substance and Sexual Activity  . Alcohol use: Yes    Alcohol/week: 2.0 standard drinks    Types: 2 Standard drinks or equivalent per week    Comment: pt states she drinks on occasion roughly 2 times per week  . Drug use: Not on file  . Sexual activity: Not on file  Lifestyle  . Physical activity    Days per week: Patient refused    Minutes per session: Patient refused  . Stress: Not on file  Relationships  . Social Herbalist on phone: Patient refused     Gets together: Patient refused    Attends religious service: Patient refused    Active member of club or organization: Patient refused    Attends meetings of clubs or organizations: Patient refused    Relationship status: Patient refused  . Intimate partner violence    Fear of current or ex partner: Patient refused    Emotionally abused: Patient refused    Physically abused: Patient refused    Forced sexual activity: Patient refused  Other Topics Concern  . Not on file  Social History Narrative   Patient refused questioning.    Outpatient Medications Prior to Visit  Medication Sig Dispense Refill  . aspirin EC 81 MG tablet Take 81 mg by mouth daily.    Marland Kitchen gabapentin (NEURONTIN) 300 MG capsule Take 1 capsule (300 mg total) by mouth as needed. 90 capsule 2  . glipiZIDE (GLUCOTROL XL) 10 MG 24 hr tablet Take 1 tablet (10 mg total) by mouth daily. 90 tablet 1  . lisinopril (ZESTRIL) 5 MG tablet Take 1 tablet (5 mg total) by mouth daily. 90 tablet 1  . metFORMIN (GLUCOPHAGE) 1000 MG tablet Take 1 tablet (1,000 mg total) by mouth 2 (two) times daily  with a meal. 60 tablet 6  . simvastatin (ZOCOR) 20 MG tablet Take 1 tablet (20 mg total) by mouth daily. 90 tablet 1   No facility-administered medications prior to visit.     No Known Allergies  ROS Review of Systems  Constitutional: Negative.   HENT: Positive for ear pain.   Eyes: Negative.   Respiratory: Negative.   Cardiovascular: Negative.   Gastrointestinal: Negative.   Endocrine: Negative.   Genitourinary: Negative.   Musculoskeletal: Positive for joint swelling (left knee).  Skin: Negative.   Allergic/Immunologic: Negative.   Neurological: Negative.   Hematological: Negative.   Psychiatric/Behavioral: Negative.       Objective:    Physical Exam  Constitutional: She is oriented to person, place, and time. She appears well-developed and well-nourished. No distress.  HENT:  Head: Normocephalic and atraumatic.   Right Ear: Hearing and tympanic membrane normal.  Left Ear: Hearing normal. Tympanic membrane is not erythematous. A middle ear effusion is present.  Nose: Mucosal edema present.  Eyes: Pupils are equal, round, and reactive to light. Conjunctivae and EOM are normal.  Neck: Normal range of motion.  Cardiovascular: Normal rate, regular rhythm and normal heart sounds.  Pulmonary/Chest: Effort normal and breath sounds normal. No respiratory distress.  Musculoskeletal:     Left knee: She exhibits decreased range of motion. Tenderness found.  Neurological: She is alert and oriented to person, place, and time.  Skin: Skin is warm and dry.  Psychiatric: She has a normal mood and affect. Her behavior is normal. Judgment and thought content normal.  Nursing note and vitals reviewed.   BP (!) 149/89   Pulse 95   Temp (!) 97.3 F (36.3 C)   Ht 5\' 1"  (1.549 m)   Wt 222 lb (100.7 kg)   SpO2 98%   BMI 41.95 kg/m  Wt Readings from Last 3 Encounters:  10/09/19 222 lb (100.7 kg)  01/14/19 220 lb 14.4 oz (100.2 kg)  12/17/18 218 lb 1.6 oz (98.9 kg)     Health Maintenance Due  Topic Date Due  . FOOT EXAM  02/09/1975  . OPHTHALMOLOGY EXAM  02/09/1975  . HIV Screening  02/09/1980  . TETANUS/TDAP  02/09/1984  . PAP SMEAR-Modifier  02/08/1986  . MAMMOGRAM  02/09/2015  . COLONOSCOPY  02/09/2015    There are no preventive care reminders to display for this patient.  No results found for: TSH Lab Results  Component Value Date   WBC 11.1 (H) 09/11/2019   HGB 14.7 09/11/2019   HCT 44.0 09/11/2019   MCV 91 09/11/2019   PLT 410 09/11/2019   Lab Results  Component Value Date   NA 141 09/11/2019   K 4.2 09/11/2019   CO2 24 09/11/2019   GLUCOSE 160 (H) 09/11/2019   BUN 14 09/11/2019   CREATININE 0.70 09/11/2019   BILITOT 0.4 09/11/2019   ALKPHOS 111 09/11/2019   AST 16 09/11/2019   ALT 17 09/11/2019   PROT 7.1 09/11/2019   ALBUMIN 4.4 09/11/2019   CALCIUM 9.8 09/11/2019   ANIONGAP  5 12/22/2015   Lab Results  Component Value Date   CHOL 172 06/05/2019   Lab Results  Component Value Date   HDL 59 06/05/2019   Lab Results  Component Value Date   LDLCALC 90 06/05/2019   Lab Results  Component Value Date   TRIG 114 06/05/2019   Lab Results  Component Value Date   CHOLHDL 2.9 06/05/2019   Lab Results  Component Value Date  HGBA1C 9.0 (H) 09/11/2019      Assessment & Plan:   Problem List Items Addressed This Visit    None    Visit Diagnoses    Eustachian tube disorder, left    -  Primary   Relevant Medications   predniSONE (DELTASONE) 20 MG tablet   fluticasone (FLONASE) 50 MCG/ACT nasal spray   Cetirizine HCl 10 MG CAPS   Chronic pain of left knee       Relevant Medications   predniSONE (DELTASONE) 20 MG tablet   ibuprofen (ADVIL) 800 MG tablet      Meds ordered this encounter  Medications  . predniSONE (DELTASONE) 20 MG tablet    Sig: Take 1 tablet (20 mg total) by mouth daily with breakfast for 3 days.    Dispense:  3 tablet    Refill:  0  . fluticasone (FLONASE) 50 MCG/ACT nasal spray    Sig: Place 1 spray into both nostrils 2 (two) times daily.    Dispense:  15.8 mL    Refill:  2  . Cetirizine HCl 10 MG CAPS    Sig: Take 1 capsule (10 mg total) by mouth daily.    Dispense:  30 capsule    Refill:  0  . ibuprofen (ADVIL) 800 MG tablet    Sig: Take 1 tablet (800 mg total) by mouth every 8 (eight) hours as needed.    Dispense:  30 tablet    Refill:  0    Follow-up: Return in about 3 months (around 01/09/2020) for DM2.    Mike Gip, FNP

## 2019-10-09 NOTE — Patient Instructions (Signed)
Health Maintenance, Female Adopting a healthy lifestyle and getting preventive care are important in promoting health and wellness. Ask your health care provider about:  The right schedule for you to have regular tests and exams.  Things you can do on your own to prevent diseases and keep yourself healthy. What should I know about diet, weight, and exercise? Eat a healthy diet   Eat a diet that includes plenty of vegetables, fruits, low-fat dairy products, and lean protein.  Do not eat a lot of foods that are high in solid fats, added sugars, or sodium. Maintain a healthy weight Body mass index (BMI) is used to identify weight problems. It estimates body fat based on height and weight. Your health care provider can help determine your BMI and help you achieve or maintain a healthy weight. Get regular exercise Get regular exercise. This is one of the most important things you can do for your health. Most adults should:  Exercise for at least 150 minutes each week. The exercise should increase your heart rate and make you sweat (moderate-intensity exercise).  Do strengthening exercises at least twice a week. This is in addition to the moderate-intensity exercise.  Spend less time sitting. Even light physical activity can be beneficial. Watch cholesterol and blood lipids Have your blood tested for lipids and cholesterol at 54 years of age, then have this test every 5 years. Have your cholesterol levels checked more often if:  Your lipid or cholesterol levels are high.  You are older than 54 years of age.  You are at high risk for heart disease. What should I know about cancer screening? Depending on your health history and family history, you may need to have cancer screening at various ages. This may include screening for:  Breast cancer.  Cervical cancer.  Colorectal cancer.  Skin cancer.  Lung cancer. What should I know about heart disease, diabetes, and high blood  pressure? Blood pressure and heart disease  High blood pressure causes heart disease and increases the risk of stroke. This is more likely to develop in people who have high blood pressure readings, are of African descent, or are overweight.  Have your blood pressure checked: ? Every 3-5 years if you are 18-39 years of age. ? Every year if you are 40 years old or older. Diabetes Have regular diabetes screenings. This checks your fasting blood sugar level. Have the screening done:  Once every three years after age 40 if you are at a normal weight and have a low risk for diabetes.  More often and at a younger age if you are overweight or have a high risk for diabetes. What should I know about preventing infection? Hepatitis B If you have a higher risk for hepatitis B, you should be screened for this virus. Talk with your health care provider to find out if you are at risk for hepatitis B infection. Hepatitis C Testing is recommended for:  Everyone born from 1945 through 1965.  Anyone with known risk factors for hepatitis C. Sexually transmitted infections (STIs)  Get screened for STIs, including gonorrhea and chlamydia, if: ? You are sexually active and are younger than 54 years of age. ? You are older than 54 years of age and your health care provider tells you that you are at risk for this type of infection. ? Your sexual activity has changed since you were last screened, and you are at increased risk for chlamydia or gonorrhea. Ask your health care provider if   you are at risk.  Ask your health care provider about whether you are at high risk for HIV. Your health care provider may recommend a prescription medicine to help prevent HIV infection. If you choose to take medicine to prevent HIV, you should first get tested for HIV. You should then be tested every 3 months for as long as you are taking the medicine. Pregnancy  If you are about to stop having your period (premenopausal) and  you may become pregnant, seek counseling before you get pregnant.  Take 400 to 800 micrograms (mcg) of folic acid every day if you become pregnant.  Ask for birth control (contraception) if you want to prevent pregnancy. Osteoporosis and menopause Osteoporosis is a disease in which the bones lose minerals and strength with aging. This can result in bone fractures. If you are 65 years old or older, or if you are at risk for osteoporosis and fractures, ask your health care provider if you should:  Be screened for bone loss.  Take a calcium or vitamin D supplement to lower your risk of fractures.  Be given hormone replacement therapy (HRT) to treat symptoms of menopause. Follow these instructions at home: Lifestyle  Do not use any products that contain nicotine or tobacco, such as cigarettes, e-cigarettes, and chewing tobacco. If you need help quitting, ask your health care provider.  Do not use street drugs.  Do not share needles.  Ask your health care provider for help if you need support or information about quitting drugs. Alcohol use  Do not drink alcohol if: ? Your health care provider tells you not to drink. ? You are pregnant, may be pregnant, or are planning to become pregnant.  If you drink alcohol: ? Limit how much you use to 0-1 drink a day. ? Limit intake if you are breastfeeding.  Be aware of how much alcohol is in your drink. In the [place] one drink equals one 12 oz bottle of beer (355 mL), one 5 oz glass of wine (148 mL), or one 1 oz glass of hard liquor (44 mL). General instructions  Schedule regular health, dental, and eye exams.  Stay current with your vaccines.  Tell your health care provider if: ? You often feel depressed. ? You have ever been abused or do not feel safe at home. Summary  Adopting a healthy lifestyle and getting preventive care are important in promoting health and wellness.  Follow your health care provider's instructions about healthy  diet, exercising, and getting tested or screened for diseases.  Follow your health care provider's instructions on monitoring your cholesterol and blood pressure. This information is not intended to replace advice given to you by your health care provider. Make sure you discuss any questions you have with your health care provider. Document Released: 06/05/2011 Document Revised: 11/13/2018 Document Reviewed: 11/13/2018 Elsevier Patient Education  2020 [employer]  

## 2019-11-14 ENCOUNTER — Emergency Department
Admission: EM | Admit: 2019-11-14 | Discharge: 2019-11-14 | Disposition: A | Discharge status: Home / Self Care | Payer: Managed Care, Other (non HMO) | Attending: Emergency Medicine | Admitting: Emergency Medicine

## 2019-11-14 ENCOUNTER — Encounter: Payer: Self-pay | Admitting: Emergency Medicine

## 2019-11-14 DIAGNOSIS — R739 Hyperglycemia, unspecified: Secondary | ICD-10-CM

## 2019-11-14 DIAGNOSIS — E1165 Type 2 diabetes mellitus with hyperglycemia: Secondary | ICD-10-CM | POA: Insufficient documentation

## 2019-11-14 DIAGNOSIS — Z7982 Long term (current) use of aspirin: Secondary | ICD-10-CM | POA: Insufficient documentation

## 2019-11-14 DIAGNOSIS — Z7984 Long term (current) use of oral hypoglycemic drugs: Secondary | ICD-10-CM | POA: Diagnosis not present

## 2019-11-14 DIAGNOSIS — Z87891 Personal history of nicotine dependence: Secondary | ICD-10-CM | POA: Insufficient documentation

## 2019-11-14 DIAGNOSIS — Z79899 Other long term (current) drug therapy: Secondary | ICD-10-CM | POA: Insufficient documentation

## 2019-11-14 DIAGNOSIS — I10 Essential (primary) hypertension: Secondary | ICD-10-CM | POA: Diagnosis not present

## 2019-11-14 LAB — URINALYSIS, COMPLETE (UACMP) WITH MICROSCOPIC
Bilirubin Urine: NEGATIVE
Glucose, UA: >=500 mg/dL — AB
Hgb urine dipstick: NEGATIVE
Ketones, ur: NEGATIVE mg/dL
Leukocytes,Ua: NEGATIVE
Nitrite: NEGATIVE
Protein, ur: NEGATIVE mg/dL
Specific Gravity, Urine: 1.023 (ref 1.005–1.030)
pH: 5 (ref 5.0–8.0)

## 2019-11-14 LAB — CBC
HCT: 42.8 % (ref 36.0–46.0)
Hemoglobin: 14.3 g/dL (ref 12.0–15.0)
MCH: 29.9 pg (ref 26.0–34.0)
MCHC: 33.4 g/dL (ref 30.0–36.0)
MCV: 89.4 fL (ref 80.0–100.0)
Platelets: 383 10*3/uL (ref 150–400)
RBC: 4.79 MIL/uL (ref 3.87–5.11)
RDW: 12.8 % (ref 11.5–15.5)
WBC: 10.2 10*3/uL (ref 4.0–10.5)
nRBC: 0 % (ref 0.0–0.2)

## 2019-11-14 LAB — BASIC METABOLIC PANEL
Anion gap: 10 (ref 5–15)
BUN: 19 mg/dL (ref 6–20)
CO2: 27 mmol/L (ref 22–32)
Calcium: 9.6 mg/dL (ref 8.9–10.3)
Chloride: 100 mmol/L (ref 98–111)
Creatinine, Ser: 0.91 mg/dL (ref 0.44–1.00)
GFR calc Af Amer: >60 mL/min (ref >60)
GFR calc non Af Amer: >60 mL/min (ref >60)
Glucose, Bld: 318 mg/dL — ABNORMAL HIGH (ref 70–99)
Potassium: 4.3 mmol/L (ref 3.5–5.1)
Sodium: 137 mmol/L (ref 135–145)

## 2019-11-14 LAB — GLUCOSE, CAPILLARY: Glucose-Capillary: 296 mg/dL — ABNORMAL HIGH (ref 70–99)

## 2019-11-14 MED ORDER — GLIPIZIDE ER 10 MG PO TB24: 10.0000 mg | ORAL_TABLET | Freq: Every day | ORAL | 1 refills | Status: DC

## 2019-11-14 NOTE — ED Notes (Signed)
MD at bedside. 

## 2019-11-14 NOTE — ED Triage Notes (Signed)
Pt reports high glucose readings x2 weeks. Pt has been out of glipizide x1 weeks. Pt denies dizziness. Pt has been taking metformin only.

## 2019-11-14 NOTE — Discharge Instructions (Addendum)
As we discussed, though your blood sugar is running high, it is not dangerous at this time.  I have refilled your glipizide, and I encourage you to continue counting your carbs and watching your sugar intake.  Please continue your medications and follow up with your regular doctor as recommended in these documents.  If you develop new or worsening symptoms that concern you, please return to the Emergency Department.

## 2019-11-14 NOTE — ED Provider Notes (Signed)
[facility] Emergency Department Provider Note  ____________________________________________   First MD Initiated Contact with Patient 11/14/19 [phone]     (approximate)  I have reviewed the triage vital signs and the nursing notes.   HISTORY  Chief Complaint Hyperglycemia    HPI Ashley Ochoa is a 53 y.o. female with a history of oral medication controlled diabetes and hypertension.  She presents for evaluation of elevated blood sugars over the last few days.  She has been out of her glipizide over the last week and has been taking only her twice daily Metformin.  She denies dizziness, visual changes, shortness of breath, cough, chest pain, nausea, vomiting, and abdominal pain.   She has had some increased urination although she says this is starting to "clear up" and denies dysuria.  She says that she always feels thirsty.  She admits to a number of recent dietary indiscretions.  She is started working out recently to help with her glycemic control.  She describes that the symptoms have been severe with blood sugars ranging between 300 and 400.  Nothing in particular makes the symptoms better or worse except as described above.  Denies fever/chills, sore throat, and any contact with COVID-19 patients.        Past Medical History:  Diagnosis Date  . Diabetes mellitus without complication (HCC)   . Hypertension     Patient Active Problem List   Diagnosis Date Noted  . Hypertension 10/12/2013  . Other and unspecified hyperlipidemia 10/12/2013  . Diabetes mellitus type 2 in obese (HCC) 10/12/2013  . Morbid obesity (HCC) 10/12/2013    Past Surgical History:  Procedure Laterality Date  . ABDOMINAL HYSTERECTOMY     partial     Prior to Admission medications   Medication Sig Start Date End Date Taking? Authorizing Provider  aspirin EC 81 MG tablet Take 81 mg by mouth daily.    [provider]  Cetirizine HCl 10 MG CAPS Take 1 capsule (10 mg  total) by mouth daily. 10/09/19   Mike Gip, FNP  fluticasone (FLONASE) 50 MCG/ACT nasal spray Place 1 spray into both nostrils 2 (two) times daily. 10/09/19   Mike Gip, FNP  gabapentin (NEURONTIN) 300 MG capsule Take 1 capsule (300 mg total) by mouth as needed. 06/12/19   Doles-Johnson, Teah, NP  glipiZIDE (GLUCOTROL XL) 10 MG 24 hr tablet Take 1 tablet (10 mg total) by mouth daily. 11/14/19   Loleta Rose, MD  ibuprofen (ADVIL) 800 MG tablet Take 1 tablet (800 mg total) by mouth every 8 (eight) hours as needed. 10/09/19   Mike Gip, FNP  lisinopril (ZESTRIL) 5 MG tablet Take 1 tablet (5 mg total) by mouth daily. 06/12/19   Doles-Johnson, Teah, NP  metFORMIN (GLUCOPHAGE) 1000 MG tablet Take 1 tablet (1,000 mg total) by mouth 2 (two) times daily with a meal. 06/12/19   Doles-Johnson, Teah, NP  simvastatin (ZOCOR) 20 MG tablet Take 1 tablet (20 mg total) by mouth daily. 06/12/19 12/09/19  Doles-Johnson, Teah, NP    Allergies Patient has no known allergies.  History reviewed. No pertinent family history.  Social History Social History   Tobacco Use  . Smoking status: Former Smoker    Packs/day: 0.50    Types: Cigarettes    Quit date: 11/24/2017    Years since quitting: 1.9  . Smokeless tobacco: Never Used  Substance Use Topics  . Alcohol use: Yes    Alcohol/week: 2.0 standard drinks    Types: 2 Standard drinks  or equivalent per week    Comment: pt states she drinks on occasion roughly 2 times per week  . Drug use: Not on file    Review of Systems Constitutional: No fever/chills.  Some increased thirst. Eyes: No visual changes. ENT: No sore throat. Cardiovascular: Denies chest pain. Respiratory: Denies shortness of breath. Gastrointestinal: No abdominal pain.  No nausea, no vomiting.  No diarrhea.  No constipation. Genitourinary: Negative for dysuria.  Increased urinary frequency. Musculoskeletal: Negative for neck pain.  Negative for back pain. Integumentary: Negative for  rash. Neurological: Negative for headaches, focal weakness or numbness.   ____________________________________________   PHYSICAL EXAM:  VITAL SIGNS: ED Triage Vitals [11/14/19 0348]  Enc Vitals Group     BP (!) 133/93     Pulse Rate 74     Resp 17     Temp 97.8 F (36.6 C)     Temp Source Oral     SpO2 99 %     Weight      Height      Head Circumference      Peak Flow      Pain Score      Pain Loc      Pain Edu?      Excl. in GC?     Constitutional: Alert and oriented.  Eyes: Conjunctivae are normal.  Head: Atraumatic. Nose: No congestion/rhinnorhea. Mouth/Throat: Patient is wearing a mask. Neck: No stridor.  No meningeal signs.   Cardiovascular: Normal rate, regular rhythm. Good peripheral circulation. Grossly normal heart sounds. Respiratory: Normal respiratory effort.  No retractions. Gastrointestinal: Soft and nontender. No distention.  Musculoskeletal: No lower extremity tenderness nor edema. No gross deformities of extremities. Neurologic:  Normal speech and language. No gross focal neurologic deficits are appreciated.  Skin:  Skin is warm, dry and intact. Psychiatric: Mood and affect are normal. Speech and behavior are normal.  ____________________________________________   LABS (all labs ordered are listed, but only abnormal results are displayed)  Labs Reviewed  BASIC METABOLIC PANEL - Abnormal; Notable for the following components:      Result Value   Glucose, Bld 318 (*)    All other components within normal limits  URINALYSIS, COMPLETE (UACMP) WITH MICROSCOPIC - Abnormal; Notable for the following components:   Color, Urine YELLOW (*)    APPearance HAZY (*)    Glucose, UA >=500 (*)    Bacteria, UA RARE (*)    All other components within normal limits  GLUCOSE, CAPILLARY - Abnormal; Notable for the following components:   Glucose-Capillary 296 (*)    All other components within normal limits  CBC  CBG MONITORING, ED  CBG MONITORING, ED    ____________________________________________  EKG  None - EKG not ordered by ED physician ____________________________________________  RADIOLOGY I, Loleta Roseory Paris Hohn, personally viewed and evaluated these images (plain radiographs) as part of my medical decision making, as well as reviewing the written report by the radiologist.  ED MD interpretation: No indication for emergent imaging  Official radiology report(s): No results found.  ____________________________________________   PROCEDURES   Procedure(s) performed (including Critical Care):  Procedures   ____________________________________________   INITIAL IMPRESSION / MDM / ASSESSMENT AND PLAN / ED COURSE  As part of my medical decision making, I reviewed the following data within the electronic MEDICAL RECORD NUMBER Nursing notes reviewed and incorporated, Labs reviewed , Old chart reviewed and Notes from prior ED visits   Differential diagnosis includes, but is not limited to, poorly controlled hyperglycemia secondary to  type 2 diabetes, DKA, HHNC, acute infection, electrolyte abnormality.  The patient is well-appearing and in no acute distress.  Stable vital signs.  Lab work is notable for hyperglycemia at 318 but otherwise normal, normal CBC, unremarkable urinalysis with glucosuria but no ketones.  No evidence of an emergent medical condition at this time.  I think the patient's blood glucose level has been poorly controlled due to being out of glipizide.  She has a primary care provider.  I am refilling her glipizide and encouraged her to call to schedule follow-up appointment in about a week with her PCP to see if medication adjustments need to be made.  I gave my usual and customary return precautions and she understands and agrees with the plan.          ____________________________________________  FINAL CLINICAL IMPRESSION(S) / ED DIAGNOSES  Final diagnoses:  Hyperglycemia     MEDICATIONS GIVEN DURING  THIS VISIT:  Medications - No data to display   ED Discharge Orders         Ordered    glipiZIDE (GLUCOTROL XL) 10 MG 24 hr tablet  Daily     11/14/19 0502          *Please note:  Ashley Ochoa was evaluated in Emergency Department on 11/14/2019 for the symptoms described in the history of present illness. She was evaluated in the context of the global COVID-19 pandemic, which necessitated consideration that the patient might be at risk for infection with the SARS-CoV-2 virus that causes COVID-19. Institutional protocols and algorithms that pertain to the evaluation of patients at risk for COVID-19 are in a state of rapid change based on information released by regulatory bodies including the CDC and federal and state organizations. These policies and algorithms were followed during the patient's care in the ED.  Some ED evaluations and interventions may be delayed as a result of limited staffing during the pandemic.*  Note:  This document was prepared using Dragon voice recognition software and may include unintentional dictation errors.   Hinda Kehr, MD 11/14/19 [phone]

## 2020-01-07 ENCOUNTER — Ambulatory Visit: Payer: Managed Care, Other (non HMO) | Admitting: Gerontology

## 2020-01-08 ENCOUNTER — Encounter: Payer: Self-pay | Admitting: Gerontology

## 2020-01-08 ENCOUNTER — Other Ambulatory Visit: Payer: Self-pay

## 2020-01-08 ENCOUNTER — Ambulatory Visit: Payer: Managed Care, Other (non HMO) | Admitting: Gerontology

## 2020-01-08 VITALS — BP 107/58 | HR 91 | Temp 97.0°F | Wt 205.9 lb

## 2020-01-08 DIAGNOSIS — Z0000 Encounter for general adult medical examination without abnormal findings: Secondary | ICD-10-CM | POA: Insufficient documentation

## 2020-01-08 DIAGNOSIS — I10 Essential (primary) hypertension: Secondary | ICD-10-CM

## 2020-01-08 DIAGNOSIS — E1169 Type 2 diabetes mellitus with other specified complication: Secondary | ICD-10-CM

## 2020-01-08 LAB — POCT GLYCOSYLATED HEMOGLOBIN (HGB A1C)

## 2020-01-08 MED ORDER — INSULIN PEN NEEDLE 29G X 5MM MISC: 1.0000 | Freq: Every day | 3 refills | Status: AC

## 2020-01-08 MED ORDER — BASAGLAR KWIKPEN 100 UNIT/ML ~~LOC~~ SOPN: 10.0000 [IU] | PEN_INJECTOR | Freq: Every day | SUBCUTANEOUS | 1 refills | Status: AC

## 2020-01-08 NOTE — Progress Notes (Signed)
Established Patient Office Visit  Subjective:  Patient ID: Ashley Ochoa, female    DOB: 1966/04/30  Age: 54 y.o. MRN: [id number]  CC:  Chief Complaint  Patient presents with  . Diabetes    HPI Ashley Ochoa presents for follow up of type 2 diabetes and hypertension. Her last HgbA1c done on 09/11/2019 was 9% and her HgbA1c was >14% when checked during her visit. She states that she checks her blood glucose every other day and it was last checked couple of days ago. Her blood glucose was checked during visit and it was 287 mg/dl. She states that she has not been adhering to ADA diet, and must have missed taking her Metformin many times. She denies hypoglycemia/ hyperglycemic symptoms, and takes gabapentin 300 mg as needed for peripheral neuropathy. She denies chest pain, palpitation, light headedness, fever and chills. Overall, she states that she's doing well and offers no further complaint.  Past Medical History:  Diagnosis Date  . Diabetes mellitus without complication (HCC)   . Hypertension     Past Surgical History:  Procedure Laterality Date  . ABDOMINAL HYSTERECTOMY     partial     No family history on file.  Social History   Socioeconomic History  . Marital status: Single    Spouse name: Not on file  . Number of children: Not on file  . Years of education: Not on file  . Highest education level: Not on file  Occupational History  . Occupation: CNA  Tobacco Use  . Smoking status: Former Smoker    Packs/day: 0.50    Types: Cigarettes    Quit date: 11/24/2017    Years since quitting: 2.1  . Smokeless tobacco: Never Used  Substance and Sexual Activity  . Alcohol use: Yes    Alcohol/week: 2.0 standard drinks    Types: 2 Standard drinks or equivalent per week    Comment: pt states she drinks on occasion roughly 2 times per week  . Drug use: Not on file  . Sexual activity: Not on file  Other Topics Concern  . Not on file  Social History Narrative   Patient refused  questioning.   Social Determinants of Health   Financial Resource Strain:   . Difficulty of Paying Living Expenses: Not on file  Food Insecurity:   . Worried About Programme researcher, broadcasting/film/video in the Last Year: Not on file  . Ran Out of Food in the Last Year: Not on file  Transportation Needs:   . Lack of Transportation (Medical): Not on file  . Lack of Transportation (Non-Medical): Not on file  Physical Activity:   . Days of Exercise per Week: Not on file  . Minutes of Exercise per Session: Not on file  Stress:   . Feeling of Stress : Not on file  Social Connections:   . Frequency of Communication with Friends and Family: Not on file  . Frequency of Social Gatherings with Friends and Family: Not on file  . Attends Religious Services: Not on file  . Active Member of Clubs or Organizations: Not on file  . Attends Banker Meetings: Not on file  . Marital Status: Not on file  Intimate Partner Violence:   . Fear of Current or Ex-Partner: Not on file  . Emotionally Abused: Not on file  . Physically Abused: Not on file  . Sexually Abused: Not on file    Outpatient Medications Prior to Visit  Medication Sig Dispense Refill  . aspirin  EC 81 MG tablet Take 81 mg by mouth daily.    . fluticasone (FLONASE) 50 MCG/ACT nasal spray Place 1 spray into both nostrils 2 (two) times daily. 15.8 mL 2  . gabapentin (NEURONTIN) 300 MG capsule Take 1 capsule (300 mg total) by mouth as needed. 90 capsule 2  . lisinopril (ZESTRIL) 5 MG tablet Take 1 tablet (5 mg total) by mouth daily. 90 tablet 1  . metFORMIN (GLUCOPHAGE) 1000 MG tablet Take 1 tablet (1,000 mg total) by mouth 2 (two) times daily with a meal. 60 tablet 6  . glipiZIDE (GLUCOTROL XL) 10 MG 24 hr tablet Take 1 tablet (10 mg total) by mouth daily. 90 tablet 1  . Cetirizine HCl 10 MG CAPS Take 1 capsule (10 mg total) by mouth daily. 30 capsule 0  . simvastatin (ZOCOR) 20 MG tablet Take 1 tablet (20 mg total) by mouth daily. 90 tablet 1   . ibuprofen (ADVIL) 800 MG tablet Take 1 tablet (800 mg total) by mouth every 8 (eight) hours as needed. (Patient not taking: Reported on 01/08/2020) 30 tablet 0   No facility-administered medications prior to visit.    No Known Allergies  ROS Review of Systems  Constitutional: Negative.   Eyes: Negative.   Respiratory: Negative.   Cardiovascular: Negative.   Endocrine: Negative.   Neurological: Negative.   Psychiatric/Behavioral: Negative.       Objective:    Physical Exam  Constitutional: She is oriented to person, place, and time. She appears well-developed.  HENT:  Head: Normocephalic and atraumatic.  Eyes: Pupils are equal, round, and reactive to light. EOM are normal.  Cardiovascular: Normal rate and regular rhythm.  Pulmonary/Chest: Effort normal and breath sounds normal.  Neurological: She is alert and oriented to person, place, and time.  Skin: Skin is warm and dry.  Psychiatric: She has a normal mood and affect. Her behavior is normal. Judgment and thought content normal.    BP (!) 107/58 (BP Location: Left Arm, Patient Position: Sitting)   Pulse 91   Temp (!) 97 F (36.1 C)   Wt 205 lb 14.4 oz (93.4 kg)   SpO2 96%   BMI 38.90 kg/m  Wt Readings from Last 3 Encounters:  01/08/20 205 lb 14.4 oz (93.4 kg)  10/09/19 222 lb (100.7 kg)  01/14/19 220 lb 14.4 oz (100.2 kg)   She lost 17 pounds in 2 months and was encouraged to continue on her weight loss regimen.  Health Maintenance Due  Topic Date Due  . OPHTHALMOLOGY EXAM  02/09/1975  . HIV Screening  02/09/1980  . TETANUS/TDAP  02/09/1984  . PAP SMEAR-Modifier  02/08/1986  . MAMMOGRAM  02/09/2015  . COLONOSCOPY  02/09/2015    There are no preventive care reminders to display for this patient.  No results found for: TSH Lab Results  Component Value Date   WBC 10.2 11/14/2019   HGB 14.3 11/14/2019   HCT 42.8 11/14/2019   MCV 89.4 11/14/2019   PLT 383 11/14/2019   Lab Results  Component Value  Date   NA 137 11/14/2019   K 4.3 11/14/2019   CO2 27 11/14/2019   GLUCOSE 318 (H) 11/14/2019   BUN 19 11/14/2019   CREATININE 0.91 11/14/2019   BILITOT 0.4 09/11/2019   ALKPHOS 111 09/11/2019   AST 16 09/11/2019   ALT 17 09/11/2019   PROT 7.1 09/11/2019   ALBUMIN 4.4 09/11/2019   CALCIUM 9.6 11/14/2019   ANIONGAP 10 11/14/2019   Lab Results  Component  Value Date   CHOL 172 06/05/2019   Lab Results  Component Value Date   HDL 59 06/05/2019   Lab Results  Component Value Date   LDLCALC 90 06/05/2019   Lab Results  Component Value Date   TRIG 114 06/05/2019   Lab Results  Component Value Date   CHOLHDL 2.9 06/05/2019   Lab Results  Component Value Date   HGBA1C  01/08/2020     Comment:     14.0      Assessment & Plan:    1. Essential hypertension - Her blood pressure is under control and she will continue on current treatment regimen.  2. Diabetes mellitus type 2 in obese (HCC) - Her HgbA1c was > 14%, her goal should be < 7%. She was encouraged to check her blood glucose tid, and her fasting blood glucose should be between 80-130 mg/dl. Diabetes Nutrition education was done during visit. She was started on 10 units of Basaglar, and was educated on injecting insulin. She has [employer] and was advised to call [facility] or [facility] to schedule a follow up appointment. She was educated on the complications of uncontrolled Diabetes, and she verbalized understanding. - POCT HgB A1C; Future - Ambulatory referral to Ophthalmology - Insulin Glargine (BASAGLAR KWIKPEN) 100 UNIT/ML SOPN; Inject 0.1 mLs (10 Units total) into the skin daily.  Dispense: 5 pen; Refill: 1 - Insulin Pen Needle 29G X MISC; 1 each by Does not apply route daily.  Dispense: 30 each; Refill: 3 - POCT HgB A1C  3. Health care maintenance  - Ambulatory referral to Hematology / Oncology for Mammogram and Pap smear.     Follow-up: Return in about 2 weeks (around  01/22/2020), or if symptoms worsen or fail to improve. It's unfortunate that she will not receive care at [facility] since she has [employer], but was advised to find a Provider and schedule a follow up appointment.   Fitzroy Mikami Trellis Paganini, NP

## 2020-01-08 NOTE — Patient Instructions (Signed)
Carbohydrate Counting for Diabetes Mellitus, Adult  Carbohydrate counting is a method of keeping track of how many carbohydrates you eat. Eating carbohydrates naturally increases the amount of sugar (glucose) in the blood. Counting how many carbohydrates you eat helps keep your blood glucose within normal limits, which helps you manage your diabetes (diabetes mellitus). It is important to know how many carbohydrates you can safely have in each meal. This is different for every person. A diet and nutrition specialist (registered dietitian) can help you make a meal plan and calculate how many carbohydrates you should have at each meal and snack. Carbohydrates are found in the following foods:  Grains, such as breads and cereals.  Dried beans and soy products.  Starchy vegetables, such as potatoes, peas, and corn.  Fruit and fruit juices.  Milk and yogurt.  Sweets and snack foods, such as cake, cookies, candy, chips, and soft drinks. How do I count carbohydrates? There are two ways to count carbohydrates in food. You can use either of the methods or a combination of both. Reading "Nutrition Facts" on packaged food The "Nutrition Facts" list is included on the labels of almost all packaged foods and beverages in the [place] It includes:  The serving size.  Information about nutrients in each serving, including the grams (g) of carbohydrate per serving. To use the "Nutrition Facts":  Decide how many servings you will have.  Multiply the number of servings by the number of carbohydrates per serving.  The resulting number is the total amount of carbohydrates that you will be having. Learning standard serving sizes of other foods When you eat carbohydrate foods that are not packaged or do not include "Nutrition Facts" on the label, you need to measure the servings in order to count the amount of carbohydrates:  Measure the foods that you will eat with a food scale or measuring cup, if  needed.  Decide how many standard-size servings you will eat.  Multiply the number of servings by 15. Most carbohydrate-rich foods have about 15 g of carbohydrates per serving. ? For example, if you eat 8 oz (170 g) of strawberries, you will have eaten 2 servings and 30 g of carbohydrates (2 servings x 15 g = 30 g).  For foods that have more than one food mixed, such as soups and casseroles, you must count the carbohydrates in each food that is included. The following list contains standard serving sizes of common carbohydrate-rich foods. Each of these servings has about 15 g of carbohydrates:   hamburger bun or  English muffin.   oz (15 mL) syrup.   oz (14 g) jelly.  1 slice of bread.  1 six-inch tortilla.  3 oz (85 g) cooked rice or pasta.  4 oz (113 g) cooked dried beans.  4 oz (113 g) starchy vegetable, such as peas, corn, or potatoes.  4 oz (113 g) hot cereal.  4 oz (113 g) mashed potatoes or  of a large baked potato.  4 oz (113 g) canned or frozen fruit.  4 oz (120 mL) fruit juice.  4-6 crackers.  6 chicken nuggets.  6 oz (170 g) unsweetened dry cereal.  6 oz (170 g) plain fat-free yogurt or yogurt sweetened with artificial sweeteners.  8 oz (240 mL) milk.  8 oz (170 g) fresh fruit or one small piece of fruit.  24 oz (680 g) popped popcorn. Example of carbohydrate counting Sample meal  3 oz (85 g) chicken breast.  6 oz (170 g)   brown rice.  4 oz (113 g) corn.  8 oz (240 mL) milk.  8 oz (170 g) strawberries with sugar-free whipped topping. Carbohydrate calculation 1. Identify the foods that contain carbohydrates: ? Rice. ? Corn. ? Milk. ? Strawberries. 2. Calculate how many servings you have of each food: ? 2 servings rice. ? 1 serving corn. ? 1 serving milk. ? 1 serving strawberries. 3. Multiply each number of servings by 15 g: ? 2 servings rice x 15 g = 30 g. ? 1 serving corn x 15 g = 15 g. ? 1 serving milk x 15 g = 15 g. ? 1  serving strawberries x 15 g = 15 g. 4. Add together all of the amounts to find the total grams of carbohydrates eaten: ? 30 g + 15 g + 15 g + 15 g = 75 g of carbohydrates total. Summary  Carbohydrate counting is a method of keeping track of how many carbohydrates you eat.  Eating carbohydrates naturally increases the amount of sugar (glucose) in the blood.  Counting how many carbohydrates you eat helps keep your blood glucose within normal limits, which helps you manage your diabetes.  A diet and nutrition specialist (registered dietitian) can help you make a meal plan and calculate how many carbohydrates you should have at each meal and snack. This information is not intended to replace advice given to you by your health care provider. Make sure you discuss any questions you have with your health care provider. Document Revised: 06/14/2017 Document Reviewed: 05/03/2016 Elsevier Patient Education  2020 [employer] Inc[employer]  

## 2020-01-14 ENCOUNTER — Other Ambulatory Visit: Payer: Self-pay

## 2020-01-22 ENCOUNTER — Ambulatory Visit: Payer: Managed Care, Other (non HMO) | Admitting: Gerontology

## 2020-01-22 ENCOUNTER — Ambulatory Visit: Payer: Self-pay

## 2020-07-13 ENCOUNTER — Other Ambulatory Visit: Payer: Self-pay | Admitting: Physician Assistant

## 2020-07-13 DIAGNOSIS — Z1231 Encounter for screening mammogram for malignant neoplasm of breast: Secondary | ICD-10-CM

## 2020-12-22 IMAGING — CR DG HIP (WITH OR WITHOUT PELVIS) 2-3V LEFT
1 series · 3 of 3 positions shown · non-contrast
Comparison: None.

CLINICAL DATA: Pain following fall

EXAM:
DG HIP (WITH OR WITHOUT PELVIS) 2-3V LEFT

[Series 1: dg hip unilat w or w/o pelvis 2-3 views  · non-contrast · 0.14mm/px · 3 of 3 slices shown]
[im 1/3]
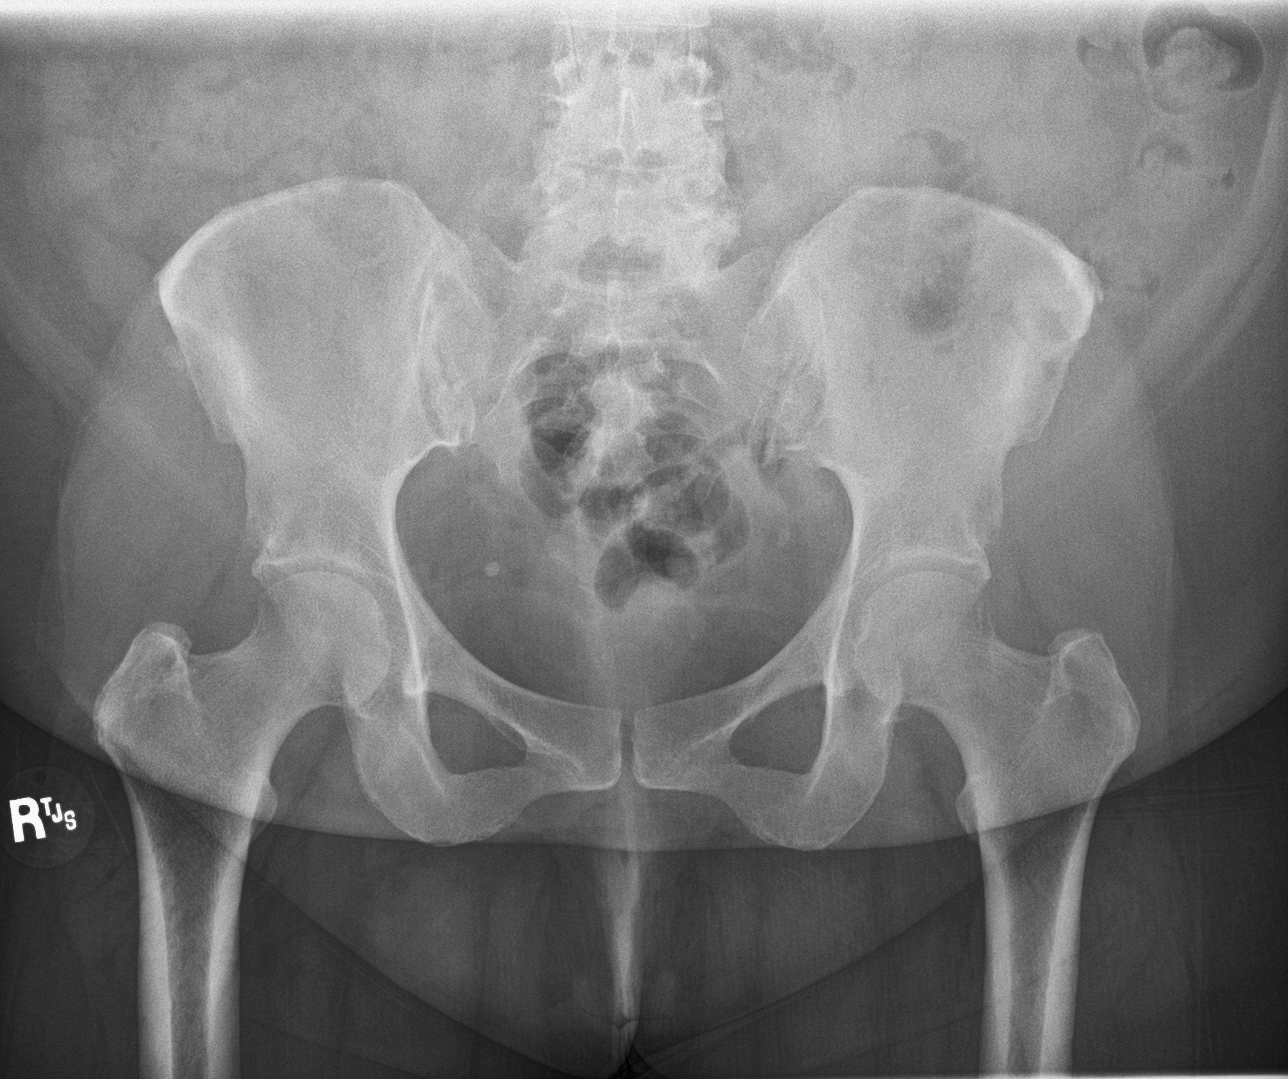
[im 2/3]
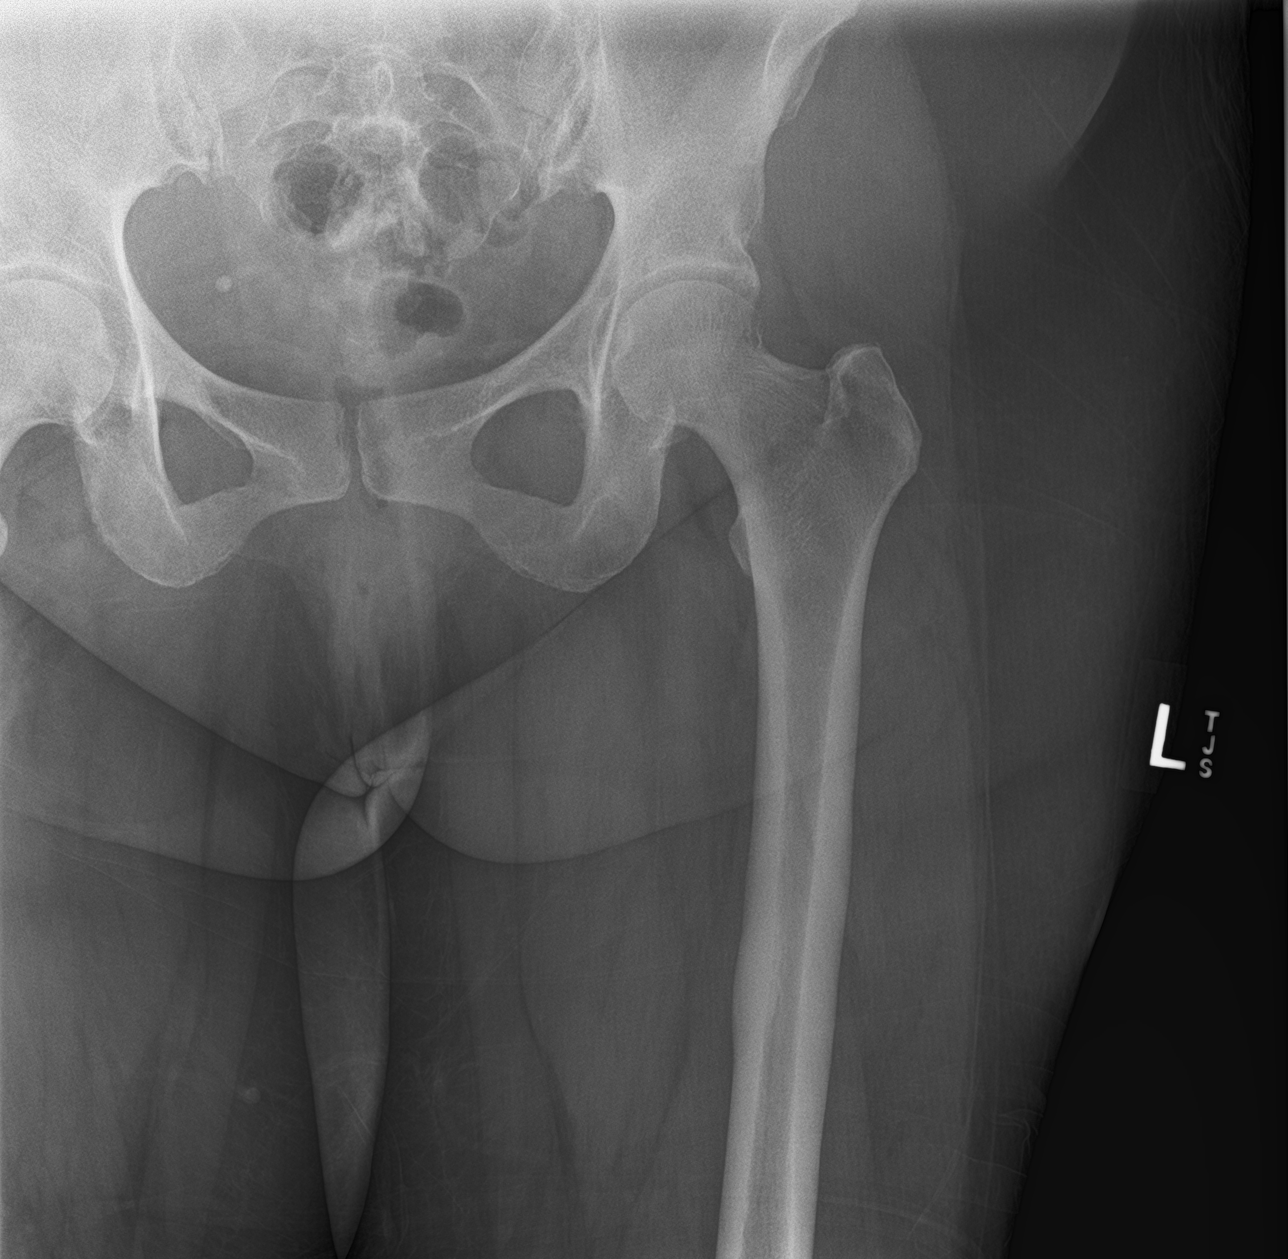
[im 3/3]
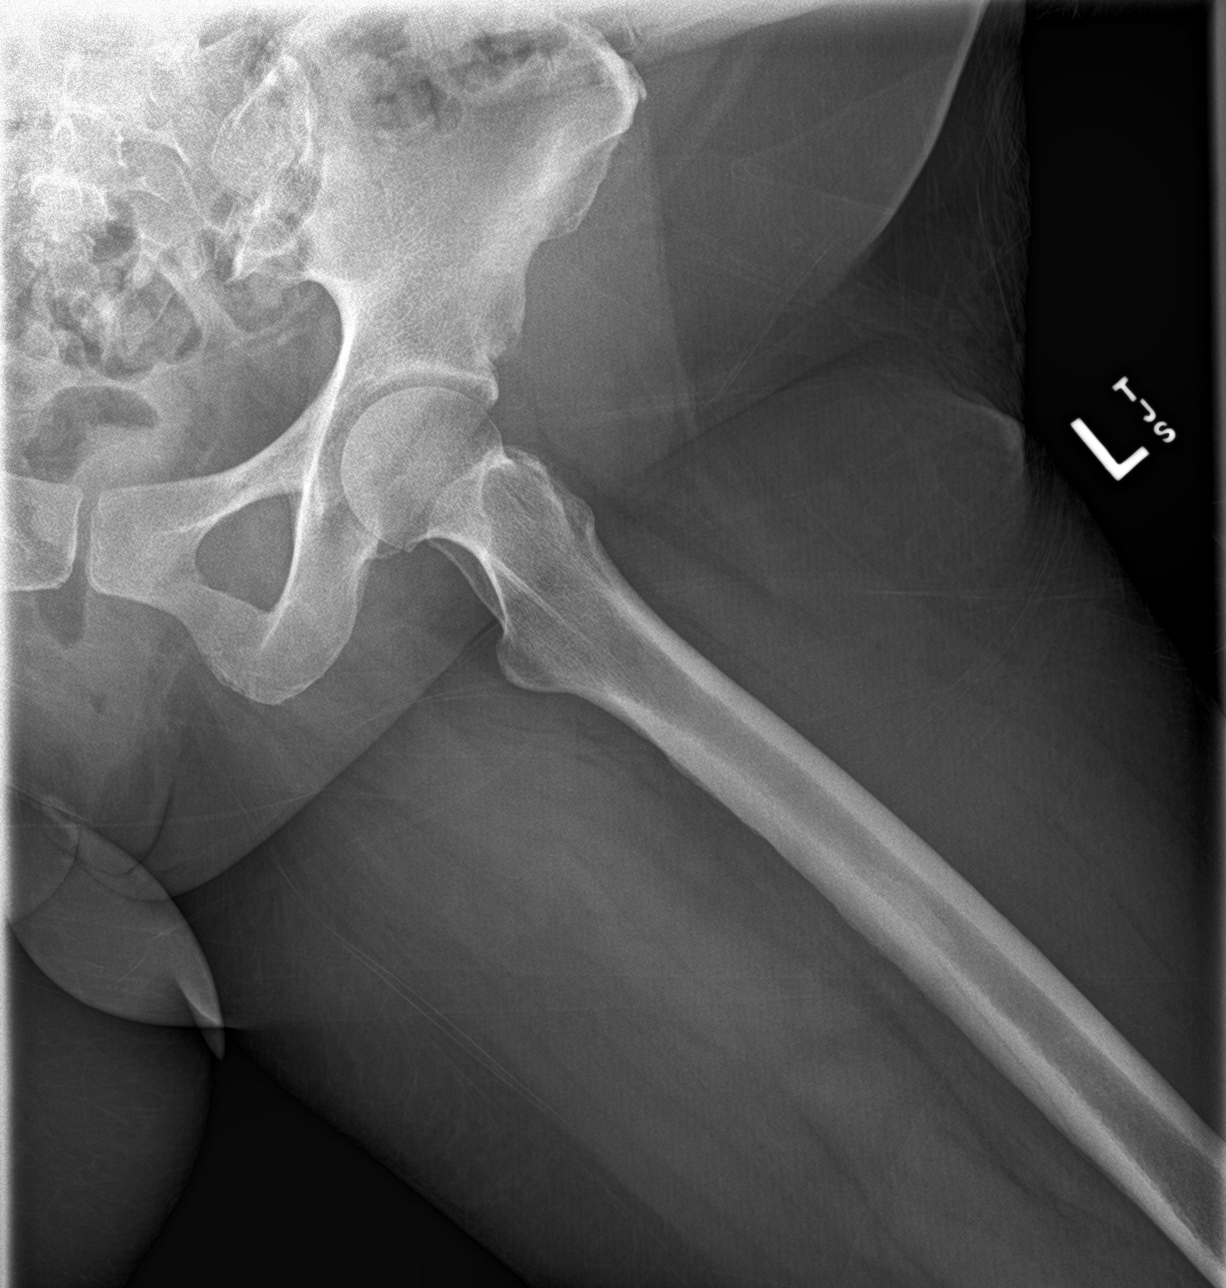

[3 of 3 positions shown; findings below may reference images not displayed]

FINDINGS: Frontal pelvis as well as frontal and lateral left hip images were
obtained. No fracture or dislocation. Joint spaces appear normal. No
erosive change.
IMPRESSION: No fracture or dislocation.  No appreciable arthropathy.

## 2021-10-17 ENCOUNTER — Other Ambulatory Visit: Payer: Self-pay

## 2021-10-17 DIAGNOSIS — Z1211 Encounter for screening for malignant neoplasm of colon: Secondary | ICD-10-CM

## 2021-10-17 MED ORDER — PEG 3350-KCL-NA BICARB-NACL 420 G PO SOLR: 4000.0000 mL | Freq: Once | ORAL | 0 refills | Status: AC

## 2021-10-17 NOTE — Progress Notes (Signed)
Gastroenterology Pre-Procedure Review  Request Date: 10/31/21 Requesting Physician: Dr. Servando Snare  PATIENT REVIEW QUESTIONS: The patient responded to the following health history questions as indicated:    1. Are you having any GI issues? no 2. Do you have a personal history of Polyps? no 3. Do you have a family history of Colon Cancer or Polyps? no 4. Diabetes Mellitus? yes (Type II) 5. Joint replacements in the past 12 months?no 6. Major health problems in the past 3 months?no 7. Any artificial heart valves, MVP, or defibrillator?no    MEDICATIONS & ALLERGIES:    Patient reports the following regarding taking any anticoagulation/antiplatelet therapy:   Plavix, Coumadin, Eliquis, Xarelto, Lovenox, Pradaxa, Brilinta, or Effient? no Aspirin? yes (81 mg)  Patient confirms/reports the following medications:  Current Outpatient Medications  Medication Sig Dispense Refill   aspirin EC 81 MG tablet Take 81 mg by mouth daily.     Cetirizine HCl 10 MG CAPS Take 1 capsule (10 mg total) by mouth daily. 30 capsule 0   fluticasone (FLONASE) 50 MCG/ACT nasal spray Place 1 spray into both nostrils 2 (two) times daily. 15.8 mL 2   gabapentin (NEURONTIN) 300 MG capsule Take 1 capsule (300 mg total) by mouth as needed. 90 capsule 2   Insulin Glargine (BASAGLAR KWIKPEN) 100 UNIT/ML SOPN Inject 0.1 mLs (10 Units total) into the skin daily. 5 pen 1   Insulin Pen Needle 29G X MISC 1 each by Does not apply route daily. 30 each 3   lisinopril (ZESTRIL) 5 MG tablet Take 1 tablet (5 mg total) by mouth daily. 90 tablet 1   metFORMIN (GLUCOPHAGE) 1000 MG tablet Take 1 tablet (1,000 mg total) by mouth 2 (two) times daily with a meal. 60 tablet 6   simvastatin (ZOCOR) 20 MG tablet Take 1 tablet (20 mg total) by mouth daily. 90 tablet 1   No current facility-administered medications for this visit.    Patient confirms/reports the following allergies:  No Known Allergies  No orders of the defined types were  placed in this encounter.   AUTHORIZATION INFORMATION Primary Insurance: 1D#: Group #:  Secondary Insurance: 1D#: Group #:  SCHEDULE INFORMATION: Date: 10/31/21  Time: Location: [facility]

## 2021-10-18 ENCOUNTER — Encounter: Payer: Self-pay | Admitting: Anesthesiology

## 2021-10-18 ENCOUNTER — Encounter: Payer: Self-pay | Admitting: Gastroenterology

## 2021-10-31 ENCOUNTER — Ambulatory Visit
Admission: RE | Admit: 2021-10-31 | Payer: BC Managed Care – PPO | Source: Home / Self Care | Admitting: Gastroenterology

## 2021-10-31 SURGERY — COLONOSCOPY WITH PROPOFOL
Anesthesia: General

## 2021-11-02 ENCOUNTER — Other Ambulatory Visit: Payer: Self-pay | Admitting: Family Medicine

## 2021-11-02 DIAGNOSIS — Z1231 Encounter for screening mammogram for malignant neoplasm of breast: Secondary | ICD-10-CM
